# Patient Record
Sex: Male | Born: 1970 | Race: Black or African American | Hispanic: No | Marital: Married | Smoking: Former smoker
Health system: Southern US, Community
[De-identification: ages and names within clinical notes are randomized; demographics above are authoritative.]

## PROBLEM LIST (undated history)

## (undated) DIAGNOSIS — I10 Essential (primary) hypertension: Secondary | ICD-10-CM

## (undated) HISTORY — PX: KNEE ARTHROSCOPY: SUR90

## (undated) HISTORY — PX: ANKLE SURGERY: SHX546

---

## 2005-06-04 ENCOUNTER — Emergency Department (HOSPITAL_COMMUNITY): Admission: EM | Admit: 2005-06-04 | Discharge: 2005-06-04 | Payer: Self-pay | Admitting: Emergency Medicine

## 2008-05-18 ENCOUNTER — Emergency Department (HOSPITAL_BASED_OUTPATIENT_CLINIC_OR_DEPARTMENT_OTHER): Admission: EM | Admit: 2008-05-18 | Discharge: 2008-05-18 | Payer: Self-pay | Admitting: Emergency Medicine

## 2010-07-18 ENCOUNTER — Emergency Department (HOSPITAL_BASED_OUTPATIENT_CLINIC_OR_DEPARTMENT_OTHER)
Admission: EM | Admit: 2010-07-18 | Discharge: 2010-07-18 | Disposition: A | Discharge status: Home / Self Care | Payer: Self-pay | Attending: Emergency Medicine | Admitting: Emergency Medicine

## 2010-07-18 ENCOUNTER — Emergency Department (INDEPENDENT_AMBULATORY_CARE_PROVIDER_SITE_OTHER): Payer: Self-pay

## 2010-07-18 DIAGNOSIS — F17200 Nicotine dependence, unspecified, uncomplicated: Secondary | ICD-10-CM | POA: Insufficient documentation

## 2010-07-18 DIAGNOSIS — M79609 Pain in unspecified limb: Secondary | ICD-10-CM | POA: Insufficient documentation

## 2010-07-18 DIAGNOSIS — M25549 Pain in joints of unspecified hand: Secondary | ICD-10-CM

## 2010-07-18 DIAGNOSIS — M25539 Pain in unspecified wrist: Secondary | ICD-10-CM

## 2013-05-10 ENCOUNTER — Encounter (HOSPITAL_BASED_OUTPATIENT_CLINIC_OR_DEPARTMENT_OTHER): Payer: Self-pay | Admitting: Emergency Medicine

## 2013-05-10 ENCOUNTER — Emergency Department (HOSPITAL_BASED_OUTPATIENT_CLINIC_OR_DEPARTMENT_OTHER)
Admission: EM | Admit: 2013-05-10 | Discharge: 2013-05-10 | Disposition: A | Discharge status: Home / Self Care | Payer: Self-pay | Attending: Emergency Medicine | Admitting: Emergency Medicine

## 2013-05-10 DIAGNOSIS — F17200 Nicotine dependence, unspecified, uncomplicated: Secondary | ICD-10-CM | POA: Insufficient documentation

## 2013-05-10 DIAGNOSIS — Y9389 Activity, other specified: Secondary | ICD-10-CM | POA: Insufficient documentation

## 2013-05-10 DIAGNOSIS — S83419A Sprain of medial collateral ligament of unspecified knee, initial encounter: Secondary | ICD-10-CM | POA: Insufficient documentation

## 2013-05-10 DIAGNOSIS — R296 Repeated falls: Secondary | ICD-10-CM | POA: Insufficient documentation

## 2013-05-10 DIAGNOSIS — I160 Hypertensive urgency: Secondary | ICD-10-CM

## 2013-05-10 DIAGNOSIS — Y929 Unspecified place or not applicable: Secondary | ICD-10-CM | POA: Insufficient documentation

## 2013-05-10 DIAGNOSIS — X500XXA Overexertion from strenuous movement or load, initial encounter: Secondary | ICD-10-CM | POA: Insufficient documentation

## 2013-05-10 DIAGNOSIS — I10 Essential (primary) hypertension: Secondary | ICD-10-CM | POA: Insufficient documentation

## 2013-05-10 HISTORY — DX: Essential (primary) hypertension: I10

## 2013-05-10 MED ORDER — HYDROCHLOROTHIAZIDE 25 MG PO TABS: 25.0000 mg | ORAL_TABLET | Freq: Every day | ORAL | Status: DC

## 2013-05-10 MED ORDER — ACETAMINOPHEN 325 MG PO TABS: 650.0000 mg | ORAL_TABLET | Freq: Four times a day (QID) | ORAL | Status: DC | PRN

## 2013-05-10 MED ORDER — IBUPROFEN 400 MG PO TABS: 400.0000 mg | ORAL_TABLET | Freq: Four times a day (QID) | ORAL | Status: DC | PRN

## 2013-05-10 NOTE — ED Notes (Signed)
Larey SeatFell wed at work inj to left knee  Slight swelling

## 2013-05-10 NOTE — ED Notes (Signed)
Pt reports falling at work several days ago.  States (L) knee pain since that time.

## 2013-05-10 NOTE — Discharge Instructions (Signed)
Hypertension As your heart beats, it forces blood through your arteries. This force is your blood pressure. If the pressure is too high, it is called hypertension (HTN) or high blood pressure. HTN is dangerous because you may have it and not know it. High blood pressure may mean that your heart has to work harder to pump blood. Your arteries may be narrow or stiff. The extra work puts you at risk for heart disease, stroke, and other problems.  Blood pressure consists of two numbers, a higher number over a lower, 110/72, for example. It is stated as "110 over 72." The ideal is below 120 for the top number (systolic) and under 80 for the bottom (diastolic). Write down your blood pressure today. You should pay close attention to your blood pressure if you have certain conditions such as:  Heart failure.  Prior heart attack.  Diabetes  Chronic kidney disease.  Prior stroke.  Multiple risk factors for heart disease. To see if you have HTN, your blood pressure should be measured while you are seated with your arm held at the level of the heart. It should be measured at least twice. A one-time elevated blood pressure reading (especially in the Emergency Department) does not mean that you need treatment. There may be conditions in which the blood pressure is different between your right and left arms. It is important to see your caregiver soon for a recheck. Most people have essential hypertension which means that there is not a specific cause. This type of high blood pressure may be lowered by changing lifestyle factors such as:  Stress.  Smoking.  Lack of exercise.  Excessive weight.  Drug/tobacco/alcohol use.  Eating less salt. Most people do not have symptoms from high blood pressure until it has caused damage to the body. Effective treatment can often prevent, delay or reduce that damage. TREATMENT  When a cause has been identified, treatment for high blood pressure is directed at the  cause. There are a large number of medications to treat HTN. These fall into several categories, and your caregiver will help you select the medicines that are best for you. Medications may have side effects. You should review side effects with your caregiver. If your blood pressure stays high after you have made lifestyle changes or started on medicines,   Your medication(s) may need to be changed.  Other problems may need to be addressed.  Be certain you understand your prescriptions, and know how and when to take your medicine.  Be sure to follow up with your caregiver within the time frame advised (usually within two weeks) to have your blood pressure rechecked and to review your medications.  If you are taking more than one medicine to lower your blood pressure, make sure you know how and at what times they should be taken. Taking two medicines at the same time can result in blood pressure that is too low. SEEK IMMEDIATE MEDICAL CARE IF:  You develop a severe headache, blurred or changing vision, or confusion.  You have unusual weakness or numbness, or a faint feeling.  You have severe chest or abdominal pain, vomiting, or breathing problems. MAKE SURE YOU:   Understand these instructions.  Will watch your condition.  Will get help right away if you are not doing well or get worse. Document Released: 01/30/2005 Document Revised: 04/24/2011 Document Reviewed: 09/20/2007 [facility] Patient Information 2014 [place], [place].  Knee Sprain A knee sprain is a tear in one of the strong, fibrous tissues  that connect the bones (ligaments) in your knee. The severity of the sprain depends on how much of the ligament is torn. The tear can be either partial or complete. CAUSES  Often, sprains are a result of a fall or injury. The force of the impact causes the fibers of your ligament to stretch too much. This excess tension causes the fibers of your ligament to tear. SIGNS AND SYMPTOMS  You  may have some loss of motion in your knee. Other symptoms include:  Bruising.  Pain in the knee area.  Tenderness of the knee to the touch.  Swelling. DIAGNOSIS  To diagnose a knee sprain, your health care provider will physically examine your knee. Your health care provider may also suggest an X-ray exam of your knee to make sure no bones are broken. TREATMENT  If your ligament is only partially torn, treatment usually involves keeping the knee in a fixed position (immobilization) or bracing your knee for activities that require movement for several weeks. To do this, your health care provider will apply a bandage, cast, or splint to keep your knee from moving and to support your knee during movement until it heals. For a partially torn ligament, the healing process usually takes 4 6 weeks. If your ligament is completely torn, depending on which ligament it is, you may need surgery to reconnect the ligament to the bone or reconstruct it. After surgery, a cast or splint may be applied and will need to stay on your knee for 4 6 weeks while your ligament heals. HOME CARE INSTRUCTIONS  Keep your injured knee elevated to decrease swelling.  To ease pain and swelling, apply ice to the injured area:  Put ice in a plastic bag.  Place a towel between your skin and the bag.  Leave the ice on for 20 minutes, 2 3 times a day.  Only take medicine for pain as directed by your health care provider.  Do not leave your knee unprotected until pain and stiffness go away (usually 4 6 weeks).  If you have a cast or splint, do not allow it to get wet. If you have been instructed not to remove it, cover it with a plastic bag when you shower or bathe. Do not swim.  Your health care provider may suggest exercises for you to do during your recovery to prevent or limit permanent weakness and stiffness. SEEK IMMEDIATE MEDICAL CARE IF:  Your cast or splint becomes damaged.  Your pain becomes worse.  You  have significant pain, swelling, or numbness below the cast or splint. MAKE SURE YOU:  Understand these instructions.  Will watch your condition.  Will get help right away if you are not doing well or get worse. Document Released: 01/30/2005 Document Revised: 11/20/2012 Document Reviewed: 09/11/2012 [facility] Patient Information 2014 [place], [place]. RICE: Routine Care for Injuries The routine care of many injuries includes Rest, Ice, Compression, and Elevation (RICE). HOME CARE INSTRUCTIONS  Rest is needed to allow your body to heal. Routine activities can usually be resumed when comfortable. Injured tendons and bones can take up to 6 weeks to heal. Tendons are the cord-like structures that attach muscle to bone.  Ice following an injury helps keep the swelling down and reduces pain.  Put ice in a plastic bag.  Place a towel between your skin and the bag.  Leave the ice on for 15-20 minutes, 03-04 times a day. Do this while awake, for the first 24 to 48 hours. After that,  continue as directed by your caregiver.  Compression helps keep swelling down. It also gives support and helps with discomfort. If an elastic bandage has been applied, it should be removed and reapplied every 3 to 4 hours. It should not be applied tightly, but firmly enough to keep swelling down. Watch fingers or toes for swelling, bluish discoloration, coldness, numbness, or excessive pain. If any of these problems occur, remove the bandage and reapply loosely. Contact your caregiver if these problems continue.  Elevation helps reduce swelling and decreases pain. With extremities, such as the arms, hands, legs, and feet, the injured area should be placed near or above the level of the heart, if possible. SEEK IMMEDIATE MEDICAL CARE IF:  You have persistent pain and swelling.  You develop redness, numbness, or unexpected weakness.  Your symptoms are getting worse rather than improving after several days. These  symptoms may indicate that further evaluation or further X-rays are needed. Sometimes, X-rays may not show a small broken bone (fracture) until 1 week or 10 days later. Make a follow-up appointment with your caregiver. Ask when your X-ray results will be ready. Make sure you get your X-ray results. Document Released: 05/14/2000 Document Revised: 04/24/2011 Document Reviewed: 07/01/2010 [facility] Patient Information 2014 [place], [place].

## 2013-05-10 NOTE — ED Provider Notes (Signed)
CSN: [id number]     Arrival date & time 05/10/13  0501 History   First MD Initiated Contact with Patient 05/10/13 [phone]     Chief Complaint  Patient presents with  . Knee Injury     (Consider location/radiation/quality/duration/timing/severity/associated sxs/prior Treatment) HPI Comments: Pt comes in with cc of knee pain. Pt reports having a fall on Wednesday, due to wet floors. Fell on to the knee, and twisted it. No pop heard, and he started walking on it immediately. Later, he developed significant swelling, which has subsided with him icing, but he continues to have a limp and medial pain - so he decided to come to the ER. No pain meds taken at home. No hx of surgeries. No laxity with ambulation. Pt also noted to have elevated BP. He is not taking his meds, as he lost insurance. He is supposed to be on 2 agents, but only remembers hydrochlorothiazide, No chest pain, dib, headaches, visual complains.  The history is provided by the patient.    Past Medical History  Diagnosis Date  . Hypertension    History reviewed. No pertinent past surgical history. History reviewed. No pertinent family history. History  Substance Use Topics  . Smoking status: Current Every Day Smoker -- 0.50 packs/day    Types: Cigarettes  . Smokeless tobacco: Not on file  . Alcohol Use: No    Review of Systems  Constitutional: Negative for activity change and appetite change.  Eyes: Negative for visual disturbance.  Respiratory: Negative for cough and shortness of breath.   Cardiovascular: Negative for chest pain.  Gastrointestinal: Negative for abdominal pain.  Genitourinary: Negative for dysuria.  Musculoskeletal: Positive for arthralgias.  Neurological: Negative for headaches.  All other systems reviewed and are negative.      Allergies  Review of patient's allergies indicates no known allergies.  Home Medications  No current outpatient prescriptions on file. BP 186/120  Pulse 82   Temp(Src) 98.4 F (36.9 C) (Oral)  Resp 16  Ht 6' (1.829 m)  Wt 175 lb (79.379 kg)  BMI 23.73 kg/m2  SpO2 100% Physical Exam  Nursing note and vitals reviewed. Constitutional: He is oriented to person, place, and time. He appears well-developed.  HENT:  Head: Normocephalic and atraumatic.  Eyes: Conjunctivae and EOM are normal. Pupils are equal, round, and reactive to light.  Neck: Normal range of motion. Neck supple.  Cardiovascular: Normal rate and regular rhythm.   Pulmonary/Chest: Effort normal and breath sounds normal.  Abdominal: Soft. Bowel sounds are normal. He exhibits no distension. There is no tenderness. There is no rebound and no guarding.  Musculoskeletal:  Left knee has mild effusion medially. There is tenderness to palpation on the medial part. Pt has no laxity with valgus, varus stress and with ant and post drawers test. Pt ambulated for me, with a mild limp. No crepitus  Neurological: He is alert and oriented to person, place, and time.  Skin: Skin is warm.    ED Course  Procedures (including critical care time) Labs Review Labs Reviewed - No data to display Imaging Review No results found.   EKG Interpretation None      MDM   Final diagnoses:  None    Pt comes in with cc of knee pain. Recent fall, 2-3 days ago, has been ambulating, doubt fracture. No joint laxity. Suspect some meniscal injury or ligamentous injury. RICE and partial weight bearing advised.  Also, BP is high. Asymptomatic. Will start him on HCTZ. [facility] info  will be provided.  Derwood KaplanAnkit Jaxon Mynhier, MD 05/10/13 [phone]

## 2013-05-10 NOTE — ED Notes (Signed)
Left knee pain from fall on wed

## 2013-05-12 NOTE — ED Notes (Signed)
Opened chart due to patient calling to ask about light duty note that was given

## 2014-03-13 ENCOUNTER — Emergency Department (HOSPITAL_BASED_OUTPATIENT_CLINIC_OR_DEPARTMENT_OTHER)
Admission: EM | Admit: 2014-03-13 | Discharge: 2014-03-13 | Disposition: A | Discharge status: Home / Self Care | Payer: BLUE CROSS/BLUE SHIELD | Attending: Emergency Medicine | Admitting: Emergency Medicine

## 2014-03-13 ENCOUNTER — Encounter (HOSPITAL_BASED_OUTPATIENT_CLINIC_OR_DEPARTMENT_OTHER): Payer: Self-pay

## 2014-03-13 ENCOUNTER — Emergency Department (HOSPITAL_BASED_OUTPATIENT_CLINIC_OR_DEPARTMENT_OTHER): Payer: BLUE CROSS/BLUE SHIELD

## 2014-03-13 DIAGNOSIS — I10 Essential (primary) hypertension: Secondary | ICD-10-CM | POA: Diagnosis not present

## 2014-03-13 DIAGNOSIS — R51 Headache: Secondary | ICD-10-CM | POA: Insufficient documentation

## 2014-03-13 DIAGNOSIS — R519 Headache, unspecified: Secondary | ICD-10-CM

## 2014-03-13 DIAGNOSIS — Z79899 Other long term (current) drug therapy: Secondary | ICD-10-CM | POA: Diagnosis not present

## 2014-03-13 DIAGNOSIS — R42 Dizziness and giddiness: Secondary | ICD-10-CM | POA: Insufficient documentation

## 2014-03-13 LAB — BASIC METABOLIC PANEL
ANION GAP: 4 — AB (ref 5–15)
BUN: 21 mg/dL (ref 6–23)
CALCIUM: 9 mg/dL (ref 8.4–10.5)
CO2: 24 mmol/L (ref 19–32)
Chloride: 108 mmol/L (ref 96–112)
Creatinine, Ser: 1.47 mg/dL — ABNORMAL HIGH (ref 0.50–1.35)
GFR calc Af Amer: 67 mL/min — ABNORMAL LOW (ref >90)
GFR calc non Af Amer: 58 mL/min — ABNORMAL LOW (ref >90)
GLUCOSE: 104 mg/dL — AB (ref 70–99)
POTASSIUM: 3.5 mmol/L (ref 3.5–5.1)
SODIUM: 136 mmol/L (ref 135–145)

## 2014-03-13 LAB — CBC WITH DIFFERENTIAL/PLATELET
BASOS ABS: 0 10*3/uL (ref 0.0–0.1)
Basophils Relative: 0 % (ref 0–1)
Eosinophils Absolute: 0.1 10*3/uL (ref 0.0–0.7)
Eosinophils Relative: 2 % (ref 0–5)
HEMATOCRIT: 39.7 % (ref 39.0–52.0)
Hemoglobin: 13.3 g/dL (ref 13.0–17.0)
LYMPHS PCT: 29 % (ref 12–46)
Lymphs Abs: 2.2 10*3/uL (ref 0.7–4.0)
MCH: 30.9 pg (ref 26.0–34.0)
MCHC: 33.5 g/dL (ref 30.0–36.0)
MCV: 92.3 fL (ref 78.0–100.0)
MONO ABS: 0.7 10*3/uL (ref 0.1–1.0)
MONOS PCT: 9 % (ref 3–12)
NEUTROS ABS: 4.6 10*3/uL (ref 1.7–7.7)
NEUTROS PCT: 60 % (ref 43–77)
PLATELETS: 249 10*3/uL (ref 150–400)
RBC: 4.3 MIL/uL (ref 4.22–5.81)
RDW: 12.9 % (ref 11.5–15.5)
WBC: 7.7 10*3/uL (ref 4.0–10.5)

## 2014-03-13 LAB — URINALYSIS, ROUTINE W REFLEX MICROSCOPIC
BILIRUBIN URINE: NEGATIVE
GLUCOSE, UA: NEGATIVE mg/dL
HGB URINE DIPSTICK: NEGATIVE
KETONES UR: NEGATIVE mg/dL
Leukocytes, UA: NEGATIVE
Nitrite: NEGATIVE
Protein, ur: NEGATIVE mg/dL
Specific Gravity, Urine: 1.02 (ref 1.005–1.030)
Urobilinogen, UA: 0.2 mg/dL (ref 0.0–1.0)
pH: 6 (ref 5.0–8.0)

## 2014-03-13 LAB — TROPONIN I

## 2014-03-13 MED ORDER — HYDROCHLOROTHIAZIDE 25 MG PO TABS
25.0000 mg | ORAL_TABLET | Freq: Once | ORAL | Status: AC
  Administered 2014-03-13: 25 mg via ORAL
  Filled 2014-03-13: qty 1

## 2014-03-13 MED ORDER — HYDROCHLOROTHIAZIDE 25 MG PO TABS: 25.0000 mg | ORAL_TABLET | Freq: Every day | ORAL | Status: DC

## 2014-03-13 NOTE — Discharge Instructions (Signed)

## 2014-03-13 NOTE — ED Notes (Signed)
Headache that started this am while walking the dog.  Denies nausea, vomiting. Hx HTN, out of medication x months.

## 2014-03-13 NOTE — ED Provider Notes (Signed)
CSN: [id number]     Arrival date & time 03/13/14  [id number] History   First MD Initiated Contact with Patient 03/13/14 [phone]     Chief Complaint  Patient presents with  . Headache     (Consider location/radiation/quality/duration/timing/severity/associated sxs/prior Treatment) Patient is a 44 y.o. male presenting with headaches.  Headache Pain location:  Generalized Quality:  Dull Radiates to:  Does not radiate Onset quality:  Gradual Duration: 20 minutes. Timing:  Constant Progression:  Partially resolved Chronicity:  New Similar to prior headaches: no   Context: exposure to cold air   Context comment:  While walking dog, associated with lightheadedness Relieved by:  Nothing Worsened by:  Nothing tried Associated symptoms: no abdominal pain, no fever, no neck pain, no neck stiffness and no URI     Past Medical History  Diagnosis Date  . Hypertension    Past Surgical History  Procedure Laterality Date  . Ankle surgery     No family history on file. History  Substance Use Topics  . Smoking status: Current Every Day Smoker -- 0.50 packs/day    Types: Cigars  . Smokeless tobacco: Not on file  . Alcohol Use: No    Review of Systems  Constitutional: Negative for fever.  Gastrointestinal: Negative for abdominal pain.  Musculoskeletal: Negative for neck pain and neck stiffness.  Neurological: Positive for headaches.  All other systems reviewed and are negative.     Allergies  Review of patient's allergies indicates no known allergies.  Home Medications   Prior to Admission medications   Medication Sig Start Date End Date Taking? Authorizing Provider  acetaminophen (TYLENOL) 325 MG tablet Take 2 tablets (650 mg total) by mouth every 6 (six) hours as needed. 05/10/13   Derwood KaplanAnkit Nanavati, MD  hydrochlorothiazide (HYDRODIURIL) 25 MG tablet Take 1 tablet (25 mg total) by mouth daily. 03/13/14   Mirian MoMatthew Gentry, MD  ibuprofen (ADVIL,MOTRIN) 400 MG tablet Take 1 tablet (400 mg  total) by mouth every 6 (six) hours as needed. 05/10/13   Ankit Nanavati, MD   BP 161/111 mmHg  Pulse 65  Temp(Src) 97.1 F (36.2 C) (Oral)  Resp 18  Ht 6' (1.829 m)  Wt 165 lb (74.844 kg)  BMI 22.37 kg/m2  SpO2 100% Physical Exam  Constitutional: He is oriented to person, place, and time. He appears well-developed and well-nourished.  HENT:  Head: Normocephalic and atraumatic.  Eyes: Conjunctivae and EOM are normal.  Neck: Normal range of motion. Neck supple.  Cardiovascular: Normal rate, regular rhythm and normal heart sounds.   Pulmonary/Chest: Effort normal and breath sounds normal. No respiratory distress.  Abdominal: He exhibits no distension. There is no tenderness. There is no rebound and no guarding.  Musculoskeletal: Normal range of motion.  Neurological: He is alert and oriented to person, place, and time. He has normal strength and normal reflexes. No cranial nerve deficit or sensory deficit. Coordination and gait normal. GCS eye subscore is 4. GCS verbal subscore is 5. GCS motor subscore is 6.  Skin: Skin is warm and dry.  Vitals reviewed.   ED Course  Procedures (including critical care time) Labs Review Labs Reviewed  BASIC METABOLIC PANEL - Abnormal; Notable for the following:    Glucose, Bld 104 (*)    Creatinine, Ser 1.47 (*)    GFR calc non Af Amer 58 (*)    GFR calc Af Amer 67 (*)    Anion gap 4 (*)    All other components within normal limits  CBC  WITH DIFFERENTIAL/PLATELET  URINALYSIS, ROUTINE W REFLEX MICROSCOPIC  TROPONIN I  TROPONIN I    Imaging Review No results found.   EKG Interpretation   Date/Time:  Friday March 13 2014 08:19:44 EST Ventricular Rate:  60 PR Interval:  180 QRS Duration: 110 QT Interval:  426 QTC Calculation: 426 R Axis:   -21 Text Interpretation:  Normal sinus rhythm Possible Left atrial enlargement  Borderline ECG No old tracing to compare Confirmed by Mirian Mo  [phone]) on 03/13/2014 8:23:22 AM       MDM   Final diagnoses:  Lightheadedness  Acute nonintractable headache, unspecified headache type    44 y.o. male with pertinent PMH of HTN presents with headache and lightheadedness as described above. Patient denies syncope. No chest pain or shortness of breath. No recent infectious symptoms. The patient has been noncompliant with his home hydrochlorothiazide. On arrival vital signs and physical exam as above.  Negative wu with exception of elevated cr. Delta troponin negative.  Discussed elevation and pt will fu with PCP, as well as start taking his BP medications.  Stressed importance of compliance and fu repeatedly.    I have reviewed all laboratory and imaging studies if ordered as above  1. Lightheadedness   2. Acute nonintractable headache, unspecified headache type         Mirian Mo, MD 03/17/14 [phone]

## 2014-04-27 ENCOUNTER — Emergency Department (HOSPITAL_BASED_OUTPATIENT_CLINIC_OR_DEPARTMENT_OTHER)
Admission: EM | Admit: 2014-04-27 | Discharge: 2014-04-27 | Disposition: A | Discharge status: Home / Self Care | Payer: BLUE CROSS/BLUE SHIELD | Attending: Emergency Medicine | Admitting: Emergency Medicine

## 2014-04-27 ENCOUNTER — Encounter (HOSPITAL_BASED_OUTPATIENT_CLINIC_OR_DEPARTMENT_OTHER): Payer: Self-pay | Admitting: Emergency Medicine

## 2014-04-27 DIAGNOSIS — Z720 Tobacco use: Secondary | ICD-10-CM | POA: Diagnosis not present

## 2014-04-27 DIAGNOSIS — M791 Myalgia: Secondary | ICD-10-CM | POA: Diagnosis not present

## 2014-04-27 DIAGNOSIS — Z79899 Other long term (current) drug therapy: Secondary | ICD-10-CM | POA: Diagnosis not present

## 2014-04-27 DIAGNOSIS — I10 Essential (primary) hypertension: Secondary | ICD-10-CM

## 2014-04-27 DIAGNOSIS — M79642 Pain in left hand: Secondary | ICD-10-CM | POA: Diagnosis present

## 2014-04-27 LAB — COMPREHENSIVE METABOLIC PANEL
ALBUMIN: 4.7 g/dL (ref 3.5–5.2)
ALK PHOS: 43 U/L (ref 39–117)
ALT: 16 U/L (ref 0–53)
ANION GAP: 8 (ref 5–15)
AST: 24 U/L (ref 0–37)
BUN: 19 mg/dL (ref 6–23)
CALCIUM: 9.2 mg/dL (ref 8.4–10.5)
CO2: 26 mmol/L (ref 19–32)
Chloride: 103 mmol/L (ref 96–112)
Creatinine, Ser: 1.53 mg/dL — ABNORMAL HIGH (ref 0.50–1.35)
GFR calc Af Amer: 64 mL/min — ABNORMAL LOW (ref >90)
GFR calc non Af Amer: 55 mL/min — ABNORMAL LOW (ref >90)
Glucose, Bld: 91 mg/dL (ref 70–99)
POTASSIUM: 4 mmol/L (ref 3.5–5.1)
Sodium: 137 mmol/L (ref 135–145)
TOTAL PROTEIN: 7.9 g/dL (ref 6.0–8.3)
Total Bilirubin: 0.5 mg/dL (ref 0.3–1.2)

## 2014-04-27 LAB — CBC WITH DIFFERENTIAL/PLATELET
BASOS PCT: 0 % (ref 0–1)
Basophils Absolute: 0 10*3/uL (ref 0.0–0.1)
Eosinophils Absolute: 0.2 10*3/uL (ref 0.0–0.7)
Eosinophils Relative: 2 % (ref 0–5)
HEMATOCRIT: 40.5 % (ref 39.0–52.0)
HEMOGLOBIN: 13.6 g/dL (ref 13.0–17.0)
LYMPHS ABS: 2.7 10*3/uL (ref 0.7–4.0)
LYMPHS PCT: 34 % (ref 12–46)
MCH: 31.3 pg (ref 26.0–34.0)
MCHC: 33.6 g/dL (ref 30.0–36.0)
MCV: 93.1 fL (ref 78.0–100.0)
MONO ABS: 0.7 10*3/uL (ref 0.1–1.0)
MONOS PCT: 8 % (ref 3–12)
NEUTROS ABS: 4.5 10*3/uL (ref 1.7–7.7)
NEUTROS PCT: 56 % (ref 43–77)
Platelets: 226 10*3/uL (ref 150–400)
RBC: 4.35 MIL/uL (ref 4.22–5.81)
RDW: 13 % (ref 11.5–15.5)
WBC: 8 10*3/uL (ref 4.0–10.5)

## 2014-04-27 LAB — BRAIN NATRIURETIC PEPTIDE: B NATRIURETIC PEPTIDE 5: 14.1 pg/mL (ref 0.0–100.0)

## 2014-04-27 MED ORDER — AMLODIPINE BESYLATE 5 MG PO TABS
5.0000 mg | ORAL_TABLET | Freq: Once | ORAL | Status: AC
  Administered 2014-04-27: 5 mg via ORAL
  Filled 2014-04-27: qty 1

## 2014-04-27 MED ORDER — AMLODIPINE BESYLATE 5 MG PO TABS: 5.0000 mg | ORAL_TABLET | Freq: Every day | ORAL | Status: DC

## 2014-04-27 NOTE — ED Provider Notes (Signed)
CSN: [id number]     Arrival date & time 04/27/14  1323 History  This chart was scribed for Glynn Octave, MD by Freida Busman, ED Scribe. This patient was seen in room MH02/MH02 and the patient's care was started 3:10 PM.    Chief Complaint  Patient presents with  . Hand Pain    right side   The history is provided by the patient. No language interpreter was used.    HPI Comments:  Russell Henderson is a 44 y.o. male with a past medical h/o HTN who presents to the Emergency Department complaining of constant swelling to his bilateral hands that he woke up with this am.He notes swelling has moderately improved since onset without intervention. He denies pain and injury to his hands. He also denies CP, HA, SOB, and lower extremity edema. He is currently non-compliant with HTN medications; has not taken HCTZ since Jan 2016.   No modifying factors noted.   Past Medical History  Diagnosis Date  . Hypertension    Past Surgical History  Procedure Laterality Date  . Ankle surgery     History reviewed. No pertinent family history. History  Substance Use Topics  . Smoking status: Current Every Day Smoker -- 0.50 packs/day    Types: Cigars  . Smokeless tobacco: Not on file  . Alcohol Use: No    Review of Systems  Constitutional: Negative for fever.  Respiratory: Negative for shortness of breath.   Cardiovascular: Negative for chest pain.  Musculoskeletal: Positive for myalgias.  Neurological: Negative for headaches.  All other systems reviewed and are negative.     Allergies  Review of patient's allergies indicates no known allergies.  Home Medications   Prior to Admission medications   Medication Sig Start Date End Date Taking? Authorizing Provider  acetaminophen (TYLENOL) 325 MG tablet Take 2 tablets (650 mg total) by mouth every 6 (six) hours as needed. 05/10/13   Derwood Kaplan, MD  amLODipine (NORVASC) 5 MG tablet Take 1 tablet (5 mg total) by mouth daily. 04/27/14    Glynn Octave, MD  hydrochlorothiazide (HYDRODIURIL) 25 MG tablet Take 1 tablet (25 mg total) by mouth daily. 03/13/14   Mirian Mo, MD  ibuprofen (ADVIL,MOTRIN) 400 MG tablet Take 1 tablet (400 mg total) by mouth every 6 (six) hours as needed. 05/10/13   Ankit Rhunette Croft, MD   BP 151/97 mmHg  Pulse 59  Temp(Src) 98.2 F (36.8 C) (Oral)  Resp 18  Ht 6' (1.829 m)  Wt 165 lb (74.844 kg)  BMI 22.37 kg/m2  SpO2 100% Physical Exam  Constitutional: He is oriented to person, place, and time. He appears well-developed and well-nourished. No distress.  HENT:  Head: Normocephalic and atraumatic.  Mouth/Throat: Oropharynx is clear and moist. No oropharyngeal exudate.  Eyes: Conjunctivae and EOM are normal. Pupils are equal, round, and reactive to light.  Neck: Normal range of motion. Neck supple.  No meningismus.  Cardiovascular: Normal rate, regular rhythm, normal heart sounds and intact distal pulses.   No murmur heard. Pulmonary/Chest: Effort normal and breath sounds normal. No respiratory distress.  Abdominal: Soft. There is no tenderness. There is no rebound and no guarding.  Musculoskeletal: Normal range of motion. He exhibits no edema or tenderness.  No appreciable hand swelling Intact radial pulses  No LE swelling   Neurological: He is alert and oriented to person, place, and time. No cranial nerve deficit. He exhibits normal muscle tone. Coordination normal.  No ataxia on finger to nose bilaterally. No pronator  drift. 5/5 strength throughout. CN 2-12 intact. Negative Romberg. Equal grip strength. Sensation intact. Gait is normal.   Skin: Skin is warm.  Psychiatric: He has a normal mood and affect. His behavior is normal.  Nursing note and vitals reviewed.   ED Course  Procedures   DIAGNOSTIC STUDIES:  Oxygen Saturation is 100% on RA, normal by my interpretation.    COORDINATION OF CARE:  3:14 PM Will order blood work. Discussed treatment plan with pt at bedside and pt  agreed to plan.  4:14 PM Pt reassessed and updated with results.   Labs Review Labs Reviewed  COMPREHENSIVE METABOLIC PANEL - Abnormal; Notable for the following:    Creatinine, Ser 1.53 (*)    GFR calc non Af Amer 55 (*)    GFR calc Af Amer 64 (*)    All other components within normal limits  CBC WITH DIFFERENTIAL/PLATELET  BRAIN NATRIURETIC PEPTIDE    Imaging Review No results found.   EKG Interpretation None      MDM   Final diagnoses:  Essential hypertension   Awoke this morning with bilateral hand swelling which is since improved. No pain. Patient noncompliant with blood pressure medication. No chest pain, headache, shortness of breath, visual changes.  Neurovascular intact. No appreciable hand swelling on exam.  Creatinine stable. Patient started on by mouth amlodipine as he did not tolerate hydrochlorothiazide.   labs at baseline. Patient asymptomatic. Blood pressure 166/111. Patient started on amlodipine. States he has PCP follow-up this week. Informed that hypertension is a chronic disease which can cause damage to brain, eyes, heart, kidneys. Return precautions discussed.  BP 151/97 mmHg  Pulse 59  Temp(Src) 98.2 F (36.8 C) (Oral)  Resp 18  Ht 6' (1.829 m)  Wt 165 lb (74.844 kg)  BMI 22.37 kg/m2  SpO2 100%   I personally performed the services described in this documentation, which was scribed in my presence. The recorded information has been reviewed and is accurate.   Glynn OctaveStephen Quandra Fedorchak, MD 04/28/14 [phone]

## 2014-04-27 NOTE — ED Notes (Signed)
Pt states he woke up this morning and his hands started swelling, he thinks it's his BP

## 2014-04-27 NOTE — Discharge Instructions (Signed)
Hypertension Follow-up with your doctor. Her blood pressure medication as prescribed. Return to the ED develop headache, chest pain, shortness of breath or any other concerns. Hypertension, commonly called high blood pressure, is when the force of blood pumping through your arteries is too strong. Your arteries are the blood vessels that carry blood from your heart throughout your body. A blood pressure reading consists of a higher number over a lower number, such as 110/72. The higher number (systolic) is the pressure inside your arteries when your heart pumps. The lower number (diastolic) is the pressure inside your arteries when your heart relaxes. Ideally you want your blood pressure below 120/80. Hypertension forces your heart to work harder to pump blood. Your arteries may become narrow or stiff. Having hypertension puts you at risk for heart disease, stroke, and other problems.  RISK FACTORS Some risk factors for high blood pressure are controllable. Others are not.  Risk factors you cannot control include:   Race. You may be at higher risk if you are African American.  Age. Risk increases with age.  Gender. Men are at higher risk than women before age 42 years. After age 42, women are at higher risk than men. Risk factors you can control include:  Not getting enough exercise or physical activity.  Being overweight.  Getting too much fat, sugar, calories, or salt in your diet.  Drinking too much alcohol. SIGNS AND SYMPTOMS Hypertension does not usually cause signs or symptoms. Extremely high blood pressure (hypertensive crisis) may cause headache, anxiety, shortness of breath, and nosebleed. DIAGNOSIS  To check if you have hypertension, your health care provider will measure your blood pressure while you are seated, with your arm held at the level of your heart. It should be measured at least twice using the same arm. Certain conditions can cause a difference in blood pressure between  your right and left arms. A blood pressure reading that is higher than normal on one occasion does not mean that you need treatment. If one blood pressure reading is high, ask your health care provider about having it checked again. TREATMENT  Treating high blood pressure includes making lifestyle changes and possibly taking medicine. Living a healthy lifestyle can help lower high blood pressure. You may need to change some of your habits. Lifestyle changes may include:  Following the DASH diet. This diet is high in fruits, vegetables, and whole grains. It is low in salt, red meat, and added sugars.  Getting at least 2 hours of brisk physical activity every week.  Losing weight if necessary.  Not smoking.  Limiting alcoholic beverages.  Learning ways to reduce stress. If lifestyle changes are not enough to get your blood pressure under control, your health care provider may prescribe medicine. You may need to take more than one. Work closely with your health care provider to understand the risks and benefits. HOME CARE INSTRUCTIONS  Have your blood pressure rechecked as directed by your health care provider.   Take medicines only as directed by your health care provider. Follow the directions carefully. Blood pressure medicines must be taken as prescribed. The medicine does not work as well when you skip doses. Skipping doses also puts you at risk for problems.   Do not smoke.   Monitor your blood pressure at home as directed by your health care provider. SEEK MEDICAL CARE IF:   You think you are having a reaction to medicines taken.  You have recurrent headaches or feel dizzy.  You  have swelling in your ankles.  You have trouble with your vision. SEEK IMMEDIATE MEDICAL CARE IF:  You develop a severe headache or confusion.  You have unusual weakness, numbness, or feel faint.  You have severe chest or abdominal pain.  You vomit repeatedly.  You have trouble  breathing. MAKE SURE YOU:   Understand these instructions.  Will watch your condition.  Will get help right away if you are not doing well or get worse. Document Released: 01/30/2005 Document Revised: 06/16/2013 Document Reviewed: 11/22/2012 [facility] Patient Information 2015 [place], [place]. This information is not intended to replace advice given to you by your health care provider. Make sure you discuss any questions you have with your health care provider.

## 2014-09-21 ENCOUNTER — Encounter (HOSPITAL_BASED_OUTPATIENT_CLINIC_OR_DEPARTMENT_OTHER): Payer: Self-pay | Admitting: *Deleted

## 2014-09-21 ENCOUNTER — Emergency Department (HOSPITAL_BASED_OUTPATIENT_CLINIC_OR_DEPARTMENT_OTHER)
Admission: EM | Admit: 2014-09-21 | Discharge: 2014-09-21 | Disposition: A | Discharge status: Home / Self Care | Payer: BLUE CROSS/BLUE SHIELD | Attending: Emergency Medicine | Admitting: Emergency Medicine

## 2014-09-21 DIAGNOSIS — Z720 Tobacco use: Secondary | ICD-10-CM | POA: Diagnosis not present

## 2014-09-21 DIAGNOSIS — I10 Essential (primary) hypertension: Secondary | ICD-10-CM | POA: Diagnosis not present

## 2014-09-21 DIAGNOSIS — L0201 Cutaneous abscess of face: Secondary | ICD-10-CM | POA: Diagnosis not present

## 2014-09-21 DIAGNOSIS — Z79899 Other long term (current) drug therapy: Secondary | ICD-10-CM | POA: Insufficient documentation

## 2014-09-21 DIAGNOSIS — L988 Other specified disorders of the skin and subcutaneous tissue: Secondary | ICD-10-CM | POA: Diagnosis present

## 2014-09-21 DIAGNOSIS — L0291 Cutaneous abscess, unspecified: Secondary | ICD-10-CM

## 2014-09-21 MED ORDER — LIDOCAINE-EPINEPHRINE (PF) 2 %-1:200000 IJ SOLN
20.0000 mL | Freq: Once | INTRAMUSCULAR | Status: AC
  Administered 2014-09-21: 20 mL via INTRADERMAL
  Filled 2014-09-21: qty 20

## 2014-09-21 MED ORDER — OXYCODONE-ACETAMINOPHEN 5-325 MG PO TABS
1.0000 | ORAL_TABLET | Freq: Once | ORAL | Status: AC
  Administered 2014-09-21: 1 via ORAL
  Filled 2014-09-21: qty 1

## 2014-09-21 NOTE — ED Notes (Signed)
Pt reports abcess to right jaw area x 1 week. Denies any fevers or other c/o.

## 2014-09-21 NOTE — ED Provider Notes (Signed)
CSN: [id number]     Arrival date & time 09/21/14  5621 History   First MD Initiated Contact with Patient 09/21/14 [phone]     Chief Complaint  Patient presents with  . Skin Problem     (Consider location/radiation/quality/duration/timing/severity/associated sxs/prior Treatment) Patient is a 44 y.o. male presenting with abscess.  Abscess Location:  Head/neck Head/neck abscess location: R jawline. Size:  2 cm Abscess quality: draining, fluctuance and painful   Red streaking: no   Duration:  1 week Progression:  Worsening Chronicity:  New Context: not diabetes   Relieved by:  Nothing Worsened by:  Nothing tried Ineffective treatments:  Draining/squeezing Associated symptoms: no anorexia, no fever, no headaches, no nausea and no vomiting     Past Medical History  Diagnosis Date  . Hypertension    Past Surgical History  Procedure Laterality Date  . Ankle surgery     History reviewed. No pertinent family history. History  Substance Use Topics  . Smoking status: Current Every Day Smoker -- 0.50 packs/day    Types: Cigars  . Smokeless tobacco: Not on file  . Alcohol Use: No    Review of Systems  Constitutional: Negative for fever.  Gastrointestinal: Negative for nausea, vomiting and anorexia.  Neurological: Negative for headaches.  All other systems reviewed and are negative.     Allergies  Review of patient's allergies indicates no known allergies.  Home Medications   Prior to Admission medications   Medication Sig Start Date End Date Taking? Authorizing Provider  acetaminophen (TYLENOL) 325 MG tablet Take 2 tablets (650 mg total) by mouth every 6 (six) hours as needed. 05/10/13   Derwood Kaplan, MD  amLODipine (NORVASC) 5 MG tablet Take 1 tablet (5 mg total) by mouth daily. Patient taking differently: Take 10 mg by mouth daily.  04/27/14   Glynn Octave, MD  ibuprofen (ADVIL,MOTRIN) 400 MG tablet Take 1 tablet (400 mg total) by mouth every 6 (six) hours as needed.  05/10/13   Ankit Rhunette Croft, MD   BP 147/94 mmHg  Pulse 62  Temp(Src) 98.6 F (37 C) (Oral)  Resp 16  Ht 6' (1.829 m)  Wt 160 lb (72.576 kg)  BMI 21.70 kg/m2  SpO2 100% Physical Exam  Constitutional: He is oriented to person, place, and time. He appears well-developed and well-nourished.  HENT:  Head: Normocephalic and atraumatic.  Eyes: Conjunctivae and EOM are normal.  Neck: Normal range of motion. Neck supple.  Cardiovascular: Normal rate, regular rhythm and normal heart sounds.   Pulmonary/Chest: Effort normal and breath sounds normal. No respiratory distress.  Abdominal: He exhibits no distension. There is no tenderness. There is no rebound and no guarding.  Musculoskeletal: Normal range of motion.  Neurological: He is alert and oriented to person, place, and time.  Skin: Skin is warm and dry.  2 cm fluctuant abscess to R mandibular angle  Vitals reviewed.   ED Course  INCISION AND DRAINAGE Date/Time: 09/21/2014 7:02 AM Performed by: Mirian Mo Authorized by: Mirian Mo Consent: Verbal consent obtained. Type: abscess Body area: head/neck Location details: face Anesthesia: local infiltration Local anesthetic: lidocaine 2% with epinephrine Patient sedated: no Scalpel size: 11 Incision type: single straight Complexity: simple Drainage: purulent Drainage amount: scant Wound treatment: wound left open Patient tolerance: Patient tolerated the procedure well with no immediate complications   (including critical care time) Labs Review Labs Reviewed - No data to display  Imaging Review No results found.   EKG Interpretation None      MDM  Final diagnoses:  Abscess    44 y.o. male with pertinent PMH of HTN presents with abscess to R face.  Draining and improved PTA.  I&D as above.  No surrounding signs of infection.  DC home in stable condition.    I have reviewed all laboratory and imaging studies if ordered as above  1. Abscess          Mirian Mo, MD 09/21/14 [phone]

## 2014-09-21 NOTE — ED Notes (Signed)
MD at bedside. 

## 2014-09-21 NOTE — Discharge Instructions (Signed)

## 2016-05-20 ENCOUNTER — Encounter (HOSPITAL_BASED_OUTPATIENT_CLINIC_OR_DEPARTMENT_OTHER): Payer: Self-pay | Admitting: *Deleted

## 2016-05-20 ENCOUNTER — Emergency Department (HOSPITAL_BASED_OUTPATIENT_CLINIC_OR_DEPARTMENT_OTHER)
Admission: EM | Admit: 2016-05-20 | Discharge: 2016-05-20 | Disposition: A | Discharge status: Home / Self Care | Payer: BLUE CROSS/BLUE SHIELD | Attending: Emergency Medicine | Admitting: Emergency Medicine

## 2016-05-20 DIAGNOSIS — K029 Dental caries, unspecified: Secondary | ICD-10-CM | POA: Insufficient documentation

## 2016-05-20 DIAGNOSIS — I10 Essential (primary) hypertension: Secondary | ICD-10-CM | POA: Insufficient documentation

## 2016-05-20 DIAGNOSIS — Z87891 Personal history of nicotine dependence: Secondary | ICD-10-CM | POA: Insufficient documentation

## 2016-05-20 MED ORDER — CLINDAMYCIN HCL 150 MG PO CAPS
300.0000 mg | ORAL_CAPSULE | Freq: Once | ORAL | Status: AC
  Administered 2016-05-20: 300 mg via ORAL
  Filled 2016-05-20: qty 2

## 2016-05-20 MED ORDER — CLINDAMYCIN HCL 300 MG PO CAPS: 300.0000 mg | ORAL_CAPSULE | Freq: Four times a day (QID) | ORAL | 0 refills | Status: AC

## 2016-05-20 MED ORDER — IBUPROFEN 800 MG PO TABS: 800.0000 mg | ORAL_TABLET | Freq: Three times a day (TID) | ORAL | 0 refills | Status: AC

## 2016-05-20 MED ORDER — IBUPROFEN 800 MG PO TABS
800.0000 mg | ORAL_TABLET | Freq: Once | ORAL | Status: AC
  Administered 2016-05-20: 800 mg via ORAL
  Filled 2016-05-20: qty 1

## 2016-05-20 NOTE — ED Triage Notes (Signed)
right sided lower jaw dental pain and swelling x 3 days has an appt in 2 weeks

## 2016-05-20 NOTE — ED Provider Notes (Signed)
[facility]   CSN: [id number] Arrival date & time: 05/20/16  0438     History   Chief Complaint Chief Complaint  Patient presents with  . Dental Pain    HPI Russell Henderson is a 46 y.o. male.  The history is provided by the patient.  Dental Pain   This is a new problem. The current episode started 2 days ago. The problem occurs constantly. The problem has not changed since onset.The pain is moderate. He has tried nothing for the symptoms. The treatment provided no relief.  Multiple bad teeth, has called a dentist who cannot see him for 2 weeks.  No f/c/r.    Past Medical History:  Diagnosis Date  . Hypertension     There are no active problems to display for this patient.   Past Surgical History:  Procedure Laterality Date  . ANKLE SURGERY    . KNEE ARTHROSCOPY         Home Medications    Prior to Admission medications   Medication Sig Start Date End Date Taking? Authorizing Provider  lisinopril (PRINIVIL,ZESTRIL) 5 MG tablet Take 5 mg by mouth daily.   Yes Historical Provider, MD  clindamycin (CLEOCIN) 300 MG capsule Take 1 capsule (300 mg total) by mouth 4 (four) times daily. X 7 days 05/20/16   Annice Jolly, MD  ibuprofen (ADVIL,MOTRIN) 800 MG tablet Take 1 tablet (800 mg total) by mouth 3 (three) times daily. 05/20/16   Cy Blamer, MD    Family History History reviewed. No pertinent family history.  Social History Social History  Substance Use Topics  . Smoking status: Former Smoker    Packs/day: 0.50    Types: Cigars  . Smokeless tobacco: Never Used  . Alcohol use No     Allergies   Patient has no known allergies.   Review of Systems Review of Systems  Constitutional: Negative for fever.  HENT: Positive for dental problem. Negative for congestion, drooling, sore throat, trouble swallowing and voice change.   Respiratory: Negative for shortness of breath.   All other systems reviewed and are  negative.    Physical Exam Updated Vital Signs BP (!) 190/122 (BP Location: Right Arm)   Pulse 95   Temp 98.7 F (37.1 C) (Oral)   Resp 16   Ht 6' (1.829 m)   Wt 165 lb (74.8 kg)   SpO2 100%   BMI 22.38 kg/m   Physical Exam  Constitutional: He is oriented to person, place, and time. He appears well-developed and well-nourished. No distress.  HENT:  Head: Normocephalic and atraumatic.  Mouth/Throat: No trismus in the jaw. Dental caries present. No uvula swelling. No oropharyngeal exudate, posterior oropharyngeal edema or posterior oropharyngeal erythema.    Eyes: Conjunctivae and EOM are normal.  Neck: Normal range of motion. Neck supple. No tracheal deviation present.  Cardiovascular: Normal rate, regular rhythm and intact distal pulses.   Pulmonary/Chest: Effort normal and breath sounds normal. He has no wheezes. He has no rales.  Abdominal: Soft. Bowel sounds are normal. There is no tenderness.  Musculoskeletal: Normal range of motion.  Lymphadenopathy:    He has no cervical adenopathy.  Neurological: He is alert and oriented to person, place, and time.  Skin: Skin is warm and dry. Capillary refill takes less than 2 seconds.  Psychiatric: He has a normal mood and affect.     ED Treatments / Results   Vitals:   05/20/16 0443  BP: (!) 190/122  Pulse: 95  Resp: 16  Temp: 98.7 F (37.1 C)    Radiology No results found.  Procedures Procedures (including critical care time)  Medications Ordered in ED Medications  clindamycin (CLEOCIN) capsule 300 mg (not administered)  ibuprofen (ADVIL,MOTRIN) tablet 800 mg (not administered)     Final Clinical Impressions(s) / ED Diagnoses   Final diagnoses:  Dental caries   The patient is nontoxic-appearing on exam and vital signs are within normal limits. Return for fevers, facial or neck swelling, inability to open mouth, intractable pain, recurrent bleeding or any concerns.  Follow up with dentistry.  Take all  antibiotics and use mouth rinses three times a day.    After history, exam, and medical workup I feel the patient has been appropriately medically screened and is safe for discharge home. Pertinent diagnoses were discussed with the patient. Patient was given return precautions. New Prescriptions New Prescriptions   CLINDAMYCIN (CLEOCIN) 300 MG CAPSULE    Take 1 capsule (300 mg total) by mouth 4 (four) times daily. X 7 days   IBUPROFEN (ADVIL,MOTRIN) 800 MG TABLET    Take 1 tablet (800 mg total) by mouth 3 (three) times daily.     Cy Blamer, MD 05/20/16 [phone]

## 2017-04-10 ENCOUNTER — Other Ambulatory Visit: Payer: Self-pay

## 2017-04-10 ENCOUNTER — Encounter (HOSPITAL_BASED_OUTPATIENT_CLINIC_OR_DEPARTMENT_OTHER): Payer: Self-pay | Admitting: Emergency Medicine

## 2017-04-10 ENCOUNTER — Emergency Department (HOSPITAL_BASED_OUTPATIENT_CLINIC_OR_DEPARTMENT_OTHER)
Admission: EM | Admit: 2017-04-10 | Discharge: 2017-04-10 | Disposition: A | Discharge status: Home / Self Care | Payer: 59 | Attending: Emergency Medicine | Admitting: Emergency Medicine

## 2017-04-10 ENCOUNTER — Emergency Department (HOSPITAL_BASED_OUTPATIENT_CLINIC_OR_DEPARTMENT_OTHER): Payer: 59

## 2017-04-10 DIAGNOSIS — M25562 Pain in left knee: Secondary | ICD-10-CM | POA: Insufficient documentation

## 2017-04-10 DIAGNOSIS — G8929 Other chronic pain: Secondary | ICD-10-CM | POA: Diagnosis not present

## 2017-04-10 DIAGNOSIS — Z79899 Other long term (current) drug therapy: Secondary | ICD-10-CM | POA: Diagnosis not present

## 2017-04-10 DIAGNOSIS — Z87891 Personal history of nicotine dependence: Secondary | ICD-10-CM | POA: Insufficient documentation

## 2017-04-10 DIAGNOSIS — I10 Essential (primary) hypertension: Secondary | ICD-10-CM | POA: Insufficient documentation

## 2017-04-10 NOTE — Discharge Instructions (Signed)
Please read instructions below. Apply ice to your knee for 20 minutes at a time. You can take advil/ibuprofen every 6 hours as needed for pain. OR you can take aleve every 12 hours. Schedule an appointment with your orthopedic specialist to follow-up on your ongoing knee pain. Return to the ER for new or concerning symptoms.

## 2017-04-10 NOTE — ED Provider Notes (Signed)
[facility] EMERGENCY DEPARTMENT Provider Note   CSN: [id number] Arrival date & time: 04/10/17  1506     History   Chief Complaint Chief Complaint  Patient presents with  . Knee Pain    HPI Russell Henderson is a 47 y.o. male with past medical history of hypertension and left meniscal injury status post arthroscopy, presenting to the ED with worsening left knee pain since Friday.  Patient denies new injuries or fall.  He states he has had 2 meniscal repair surgeries in the past, [facility]  He states he was scheduled to have a third, however had insurance issues and is that done.  States he attempted to have appointment with Dr. Jillyn HiddenBean today, however was unable to.  States pain is typical of his left knee pain, worse in the posterior aspect, and aggravated by ambulation and movement.  States he uses a cane ambulate.  Has not taken any over-the-counter medications for his symptoms.  The history is provided by the patient.    Past Medical History:  Diagnosis Date  . Hypertension     There are no active problems to display for this patient.   Past Surgical History:  Procedure Laterality Date  . ANKLE SURGERY    . KNEE ARTHROSCOPY         Home Medications    Prior to Admission medications   Medication Sig Start Date End Date Taking? Authorizing Provider  clindamycin (CLEOCIN) 300 MG capsule Take 1 capsule (300 mg total) by mouth 4 (four) times daily. X 7 days 05/20/16   Palumbo, April, MD  ibuprofen (ADVIL,MOTRIN) 800 MG tablet Take 1 tablet (800 mg total) by mouth 3 (three) times daily. 05/20/16   Palumbo, April, MD  lisinopril (PRINIVIL,ZESTRIL) 5 MG tablet Take 5 mg by mouth daily.    [provider]    Family History History reviewed. No pertinent family history.  Social History Social History   Tobacco Use  . Smoking status: Former Smoker    Packs/day: 0.50    Types: Cigars  . Smokeless tobacco: Never  Used  Substance Use Topics  . Alcohol use: No  . Drug use: No     Allergies   Patient has no known allergies.   Review of Systems Review of Systems  Musculoskeletal: Positive for arthralgias. Negative for joint swelling.  Skin: Negative for color change.     Physical Exam Updated Vital Signs BP (!) 179/125 (BP Location: Left Arm)   Pulse 85   Temp 98.7 F (37.1 C) (Oral)   Resp 18   Ht 6' (1.829 m)   Wt 85.3 kg (188 lb)   SpO2 100%   BMI 25.50 kg/m   Physical Exam  Constitutional: He appears well-developed and well-nourished. No distress.  HENT:  Head: Normocephalic and atraumatic.  Eyes: Conjunctivae are normal.  Cardiovascular: Normal rate and intact distal pulses.  Pulmonary/Chest: Effort normal.  Musculoskeletal:  Left knee with old vertical surgical scar over anterior aspect.  No edema, erythema or warmth to the joint.  TTP over bilateral joint line and posteriorly.  No crepitus with normal range of motion.  Psychiatric: He has a normal mood and affect. His behavior is normal.  Nursing note and vitals reviewed.    ED Treatments / Results  Labs (all labs ordered are listed, but only abnormal results are displayed) Labs Reviewed - No data to display  EKG  EKG Interpretation None       Radiology  Dg Knee Complete 4 Views Left  Result Date: 04/10/2017 CLINICAL DATA:  Knee pain and swelling.  No injury. EXAM: LEFT KNEE - COMPLETE 4+ VIEW COMPARISON:  None. FINDINGS: No acute fracture or dislocation. Mild tricompartmental compartment joint space narrowing. No significant joint effusion. Bipartite patella. Bone mineralization is normal. Soft tissues are unremarkable. IMPRESSION: 1. No acute osseous abnormality. Mild tricompartmental osteoarthritis. Electronically Signed   By: Obie Dredge M.D.   On: 04/10/2017 15:35    Procedures Procedures (including critical care time)  Medications Ordered in ED Medications - No data to display   Initial  Impression / Assessment and Plan / ED Course  I have reviewed the triage vital signs and the nursing notes.  Pertinent labs & imaging results that were available during my care of the patient were reviewed by me and considered in my medical decision making (see chart for details).     Patient is to have left meniscal injury, presenting to the ED with persistent chronic left knee pain.  X-ray done in triage is negative for acute pathology, however showing tricompartmental osteoarthritis.  Pt advised to follow up with his orthopedic provider. Conservative therapy recommended and discussed. Patient will be dc home & is agreeable with above plan.  Discussed results, findings, treatment and follow up. Patient advised of return precautions. Patient verbalized understanding and agreed with plan.  Final Clinical Impressions(s) / ED Diagnoses   Final diagnoses:  Chronic pain of left knee    ED Discharge Orders    None       Robinson, [place] N, PA-C 04/10/17 1747    Little, Ambrose [place], MD 04/11/17 [phone]

## 2017-04-10 NOTE — ED Triage Notes (Signed)
Patient states that he is having pain to his left knee. He has had a torn meniscus.

## 2019-05-13 ENCOUNTER — Other Ambulatory Visit: Payer: Self-pay

## 2019-05-13 ENCOUNTER — Emergency Department (HOSPITAL_BASED_OUTPATIENT_CLINIC_OR_DEPARTMENT_OTHER): Payer: 59

## 2019-05-13 ENCOUNTER — Encounter (HOSPITAL_BASED_OUTPATIENT_CLINIC_OR_DEPARTMENT_OTHER): Payer: Self-pay | Admitting: *Deleted

## 2019-05-13 ENCOUNTER — Emergency Department (HOSPITAL_BASED_OUTPATIENT_CLINIC_OR_DEPARTMENT_OTHER)
Admission: EM | Admit: 2019-05-13 | Discharge: 2019-05-13 | Disposition: A | Discharge status: Home / Self Care | Payer: 59 | Attending: Emergency Medicine | Admitting: Emergency Medicine

## 2019-05-13 DIAGNOSIS — Z79899 Other long term (current) drug therapy: Secondary | ICD-10-CM | POA: Diagnosis not present

## 2019-05-13 DIAGNOSIS — Z87891 Personal history of nicotine dependence: Secondary | ICD-10-CM | POA: Insufficient documentation

## 2019-05-13 DIAGNOSIS — M79605 Pain in left leg: Secondary | ICD-10-CM

## 2019-05-13 DIAGNOSIS — I10 Essential (primary) hypertension: Secondary | ICD-10-CM | POA: Diagnosis not present

## 2019-05-13 MED ORDER — DICLOFENAC SODIUM 75 MG PO TBEC: 75.0000 mg | DELAYED_RELEASE_TABLET | Freq: Two times a day (BID) | ORAL | 0 refills | Status: AC

## 2019-05-13 NOTE — Discharge Instructions (Addendum)
See your Physician for recheck.  Return if any problems.  °

## 2019-05-13 NOTE — ED Notes (Signed)
Pt. Reports he feels like he has a cramp in the L calf all the time for the last 2 weeks and no trouble breathing and no cough.  Pt. Reports his b/p is always high even with his meds.

## 2019-05-13 NOTE — ED Triage Notes (Signed)
Pt. Reports L leg pain started 2 weeks ago behind the L knee and behind the L calf.  Pt. Reports no injury to the L leg or L knee.

## 2019-05-13 NOTE — ED Notes (Signed)
Pt. Is back from radiology.  Pt. Has been up and down from room to restroom.

## 2019-05-15 NOTE — ED Provider Notes (Signed)
[facility] EMERGENCY DEPARTMENT Provider Note   CSN: [id number] Arrival date & time: 05/13/19  2048     History Chief Complaint  Patient presents with  . Leg Pain    Russell Henderson is a 49 y.o. male.  The history is provided by the patient. No language interpreter was used.  Leg Pain Location:  Leg Leg location:  L leg Pain details:    Quality:  Aching   Severity:  Moderate   Onset quality:  Gradual   Timing:  Constant   Progression:  Worsening Prior injury to area:  No Relieved by:  Nothing Worsened by:  Nothing Ineffective treatments:  None tried Associated symptoms: no numbness   Risk factors: no recent illness   Pt complains of pain to his calf and the back of his leg..  Pt has had knee surgery.  Pt reports knee joint feels fine.      Past Medical History:  Diagnosis Date  . Hypertension     There are no problems to display for this patient.   Past Surgical History:  Procedure Laterality Date  . ANKLE SURGERY    . KNEE ARTHROSCOPY         No family history on file.  Social History   Tobacco Use  . Smoking status: Former Smoker    Packs/day: 0.50    Types: Cigars  . Smokeless tobacco: Never Used  Substance Use Topics  . Alcohol use: No  . Drug use: No    Home Medications Prior to Admission medications   Medication Sig Start Date End Date Taking? Authorizing Provider  amlodipine-atorvastatin (CADUET) 10-10 MG tablet Take 1 tablet by mouth daily.   Yes [provider]  ibuprofen (ADVIL,MOTRIN) 800 MG tablet Take 1 tablet (800 mg total) by mouth 3 (three) times daily. 05/20/16  Yes Palumbo, April, MD  lisinopril (PRINIVIL,ZESTRIL) 5 MG tablet Take 5 mg by mouth daily.   Yes [provider]  omeprazole (PRILOSEC) 20 MG capsule Take 20 mg by mouth daily.   Yes [provider]  clindamycin (CLEOCIN) 300 MG capsule Take 1 capsule (300 mg total) by mouth 4 (four) times daily. X 7 days 05/20/16   Nicanor Alcon, April,  MD  diclofenac (VOLTAREN) 75 MG EC tablet Take 1 tablet (75 mg total) by mouth 2 (two) times daily. 05/13/19   Elson Areas, PA-C    Allergies    Cortisone  Review of Systems   Review of Systems  Musculoskeletal: Positive for myalgias. Negative for joint swelling.  All other systems reviewed and are negative.   Physical Exam Updated Vital Signs BP (!) 169/123 (BP Location: Left Arm)   Pulse 99   Temp 98.3 F (36.8 C) (Oral)   Resp 16   Ht 6' (1.829 m)   Wt 81.6 kg   SpO2 100%   BMI 24.41 kg/m   Physical Exam Vitals and nursing note reviewed.  Constitutional:      Appearance: He is well-developed.  HENT:     Head: Normocephalic and atraumatic.  Eyes:     Conjunctiva/sclera: Conjunctivae normal.  Cardiovascular:     Rate and Rhythm: Normal rate.     Heart sounds: No murmur.  Pulmonary:     Effort: Pulmonary effort is normal. No respiratory distress.  Abdominal:     Tenderness: There is no abdominal tenderness.  Musculoskeletal:        General: Tenderness present. No swelling, deformity or signs of injury.     Comments:  Tender left calf muscle,  Positive homan's, knee joint nontender nv and ns intact  Skin:    General: Skin is warm and dry.  Neurological:     General: No focal deficit present.     Mental Status: He is alert.  Psychiatric:        Mood and Affect: Mood normal.     ED Results / Procedures / Treatments   Labs (all labs ordered are listed, but only abnormal results are displayed) Labs Reviewed - No data to display  EKG None  Radiology US Venous Img Lower Unilateral Left  Result Date: 05/13/2019 CLINICAL DATA:  Pain. EXAM: RIGHT LOWER EXTREMITY VENOUS DOPPLER ULTRASOUND TECHNIQUE: Gray-scale sonography with graded compression, as well as color Doppler and duplex ultrasound were performed to evaluate the lower extremity deep venous systems from the level of the common femoral vein and including the common femoral, femoral, profunda femoral,  popliteal and calf veins including the posterior tibial, peroneal and gastrocnemius veins when visible. The superficial great saphenous vein was also interrogated. Spectral Doppler was utilized to evaluate flow at rest and with distal augmentation maneuvers in the common femoral, femoral and popliteal veins. COMPARISON:  None. FINDINGS: Contralateral Common Femoral Vein: Respiratory phasicity is normal and symmetric with the symptomatic side. No evidence of thrombus. Normal compressibility. Common Femoral Vein: No evidence of thrombus. Normal compressibility, respiratory phasicity and response to augmentation. Saphenofemoral Junction: No evidence of thrombus. Normal compressibility and flow on color Doppler imaging. Profunda Femoral Vein: No evidence of thrombus. Normal compressibility and flow on color Doppler imaging. Femoral Vein: No evidence of thrombus. Normal compressibility, respiratory phasicity and response to augmentation. Popliteal Vein: No evidence of thrombus. Normal compressibility, respiratory phasicity and response to augmentation. Calf Veins: No evidence of thrombus. Normal compressibility and flow on color Doppler imaging. Superficial Great Saphenous Vein: No evidence of thrombus. Normal compressibility. Other Findings:  None. IMPRESSION: No evidence of deep venous thrombosis. Electronically Signed   By: Constance Holster M.D.   On: 05/13/2019 22:01    Procedures Procedures (including critical care time)  Medications Ordered in ED Medications - No data to display  ED Course  I have reviewed the triage vital signs and the nursing notes.  Pertinent labs & imaging results that were available during my care of the patient were reviewed by me and considered in my medical decision making (see chart for details).    MDM Rules/Calculators/A&P                      MDM  Ultrasound normal  Pt counseled on possible muscle strain  Final Clinical Impression(s) / ED Diagnoses Final diagnoses:   Left leg pain    Rx / DC Orders ED Discharge Orders         Ordered    diclofenac (VOLTAREN) 75 MG EC tablet  2 times daily     05/13/19 2227        An After Visit Summary was printed and given to the patient.    Fransico Meadow, [place] 05/15/19 1657    Margette Fast, MD 05/19/19 1510

## 2020-08-29 ENCOUNTER — Emergency Department (HOSPITAL_BASED_OUTPATIENT_CLINIC_OR_DEPARTMENT_OTHER): Payer: BLUE CROSS/BLUE SHIELD

## 2020-08-29 ENCOUNTER — Emergency Department (HOSPITAL_BASED_OUTPATIENT_CLINIC_OR_DEPARTMENT_OTHER)
Admission: EM | Admit: 2020-08-29 | Discharge: 2020-08-29 | Disposition: A | Discharge status: Home / Self Care | Payer: BLUE CROSS/BLUE SHIELD | Attending: Emergency Medicine | Admitting: Emergency Medicine

## 2020-08-29 ENCOUNTER — Other Ambulatory Visit: Payer: Self-pay

## 2020-08-29 ENCOUNTER — Encounter (HOSPITAL_BASED_OUTPATIENT_CLINIC_OR_DEPARTMENT_OTHER): Payer: Self-pay

## 2020-08-29 DIAGNOSIS — Z79899 Other long term (current) drug therapy: Secondary | ICD-10-CM | POA: Insufficient documentation

## 2020-08-29 DIAGNOSIS — U071 COVID-19: Secondary | ICD-10-CM | POA: Insufficient documentation

## 2020-08-29 DIAGNOSIS — Z28310 Unvaccinated for covid-19: Secondary | ICD-10-CM | POA: Insufficient documentation

## 2020-08-29 DIAGNOSIS — I10 Essential (primary) hypertension: Secondary | ICD-10-CM | POA: Insufficient documentation

## 2020-08-29 DIAGNOSIS — Z87891 Personal history of nicotine dependence: Secondary | ICD-10-CM | POA: Insufficient documentation

## 2020-08-29 DIAGNOSIS — R509 Fever, unspecified: Secondary | ICD-10-CM

## 2020-08-29 LAB — RESP PANEL BY RT-PCR (FLU A&B, COVID) ARPGX2
Influenza A by PCR: NEGATIVE
Influenza B by PCR: NEGATIVE
SARS Coronavirus 2 by RT PCR: POSITIVE — AB

## 2020-08-29 LAB — CBC WITH DIFFERENTIAL/PLATELET
Abs Immature Granulocytes: 0.03 10*3/uL (ref 0.00–0.07)
Basophils Absolute: 0 10*3/uL (ref 0.0–0.1)
Basophils Relative: 0 %
Eosinophils Absolute: 0.1 10*3/uL (ref 0.0–0.5)
Eosinophils Relative: 1 %
HCT: 38.6 % — ABNORMAL LOW (ref 39.0–52.0)
Hemoglobin: 13.3 g/dL (ref 13.0–17.0)
Immature Granulocytes: 0 %
Lymphocytes Relative: 4 %
Lymphs Abs: 0.4 10*3/uL — ABNORMAL LOW (ref 0.7–4.0)
MCH: 32.3 pg (ref 26.0–34.0)
MCHC: 34.5 g/dL (ref 30.0–36.0)
MCV: 93.7 fL (ref 80.0–100.0)
Monocytes Absolute: 0.8 10*3/uL (ref 0.1–1.0)
Monocytes Relative: 8 %
Neutro Abs: 8.3 10*3/uL — ABNORMAL HIGH (ref 1.7–7.7)
Neutrophils Relative %: 87 %
Platelets: 229 10*3/uL (ref 150–400)
RBC: 4.12 MIL/uL — ABNORMAL LOW (ref 4.22–5.81)
RDW: 13.2 % (ref 11.5–15.5)
WBC: 9.7 10*3/uL (ref 4.0–10.5)
nRBC: 0 % (ref 0.0–0.2)

## 2020-08-29 LAB — BASIC METABOLIC PANEL
Anion gap: 7 (ref 5–15)
BUN: 18 mg/dL (ref 6–20)
CO2: 24 mmol/L (ref 22–32)
Calcium: 8.8 mg/dL — ABNORMAL LOW (ref 8.9–10.3)
Chloride: 106 mmol/L (ref 98–111)
Creatinine, Ser: 1.68 mg/dL — ABNORMAL HIGH (ref 0.61–1.24)
GFR, Estimated: 50 mL/min — ABNORMAL LOW (ref >60)
Glucose, Bld: 108 mg/dL — ABNORMAL HIGH (ref 70–99)
Potassium: 3.9 mmol/L (ref 3.5–5.1)
Sodium: 137 mmol/L (ref 135–145)

## 2020-08-29 LAB — LACTIC ACID, PLASMA: Lactic Acid, Venous: 1.6 mmol/L (ref 0.5–1.9)

## 2020-08-29 MED ORDER — IPRATROPIUM-ALBUTEROL 0.5-2.5 (3) MG/3ML IN SOLN
3.0000 mL | RESPIRATORY_TRACT | Status: AC
  Administered 2020-08-29: 3 mL via RESPIRATORY_TRACT

## 2020-08-29 MED ORDER — IPRATROPIUM-ALBUTEROL 0.5-2.5 (3) MG/3ML IN SOLN
RESPIRATORY_TRACT | Status: AC
  Filled 2020-08-29: qty 3

## 2020-08-29 MED ORDER — KETOROLAC TROMETHAMINE 30 MG/ML IJ SOLN
30.0000 mg | Freq: Once | INTRAMUSCULAR | Status: AC
  Administered 2020-08-29: 30 mg via INTRAVENOUS
  Filled 2020-08-29: qty 1

## 2020-08-29 MED ORDER — ONDANSETRON HCL 4 MG PO TABS: 4.0000 mg | ORAL_TABLET | Freq: Three times a day (TID) | ORAL | 0 refills | Status: AC | PRN

## 2020-08-29 MED ORDER — ACETAMINOPHEN 500 MG PO TABS
1000.0000 mg | ORAL_TABLET | Freq: Once | ORAL | Status: AC
  Administered 2020-08-29: 1000 mg via ORAL
  Filled 2020-08-29: qty 2

## 2020-08-29 NOTE — ED Provider Notes (Signed)
Patient signed out to me by previous provider.  Please refer to their note for full HPI.  Briefly this is a 50 year old male who presented to the emergency department with fever and headache.  Patient has been treated symptomatically, work-up otherwise is very reassuring, high suspicion for COVID infection, COVID testing is pending. Physical Exam  BP (!) 164/100   Pulse 91   Temp 100 F (37.8 C) (Oral)   Resp 18   Ht 6' (1.829 m)   Wt 83.9 kg   SpO2 100%   BMI 25.09 kg/m   Physical Exam Vitals and nursing note reviewed.  Constitutional:      General: He is not in acute distress.    Appearance: Normal appearance. He is not ill-appearing, toxic-appearing or diaphoretic.  HENT:     Head: Normocephalic.     Mouth/Throat:     Mouth: Mucous membranes are moist.  Cardiovascular:     Rate and Rhythm: Normal rate.  Pulmonary:     Effort: Pulmonary effort is normal. No respiratory distress.  Abdominal:     Palpations: Abdomen is soft.     Tenderness: There is no abdominal tenderness.  Musculoskeletal:     Cervical back: No rigidity.  Skin:    General: Skin is warm.  Neurological:     Mental Status: He is alert and oriented to person, place, and time. Mental status is at baseline.  Psychiatric:        Mood and Affect: Mood normal.    ED Course/Procedures     Procedures  MDM   COVID swab is positive.  We believe this is what is contributing to the symptoms.  No signs of meningitis.  Vitals are stable.  Patient is otherwise well-appearing.  He has past medical history of hypertension but otherwise does not appear to be high risk for severe COVID infection/hospitalization.  I had a shared discussion with the patient about Paxlovid and other treatments for COVID.  At this time he declines and would like to treat himself symptomatically at home.  Educated the patient on Covid precautions, treatments and expectations.  Patient is stable for discharge and treatment as an outpatient.  Patient agrees with the discharge plan/strict return precautions and verbalizes understanding.       Rozelle Logan, [place] 08/29/20 8756

## 2020-08-29 NOTE — Discharge Instructions (Addendum)
You have been evaluated in the Emergency Department. You are Covid positive.  You are to isolate and quarantine yourself.  Please refer to the information attached with this packet.  Treat yourself symptomatically with over the counter medication as you would for a viral illness.  Take Tylenol and Ibuprofen for pain and fever control. Stay well hydrated. If you have any worsening or severe symptoms, difficulty breathing, concern for your health please return to the emergency department. 

## 2020-08-29 NOTE — ED Provider Notes (Signed)
[facility] EMERGENCY DEPARTMENT Provider Note   CSN: [id number] Arrival date & time: 08/29/20  0516     History Chief Complaint  Patient presents with   Fever   Headache    Russell Henderson is a 50 y.o. male.  Patient reports that he woke up this morning with a headache and a fever.  He went to bed feeling well.  He has not had any sore throat, nasal congestion, cough, nausea, vomiting, diarrhea.  He does not have any known sick contacts.  He has not been vaccinated for COVID, did have COVID infection 1 year ago.      Past Medical History:  Diagnosis Date   Hypertension     There are no problems to display for this patient.   Past Surgical History:  Procedure Laterality Date   ANKLE SURGERY     KNEE ARTHROSCOPY         History reviewed. No pertinent family history.  Social History   Tobacco Use   Smoking status: Former    Packs/day: 0.50    Types: Cigars, Cigarettes   Smokeless tobacco: Never  Substance Use Topics   Alcohol use: No   Drug use: No    Home Medications Prior to Admission medications   Medication Sig Start Date End Date Taking? Authorizing Provider  amlodipine-atorvastatin (CADUET) 10-10 MG tablet Take 1 tablet by mouth daily.    [provider]  clindamycin (CLEOCIN) 300 MG capsule Take 1 capsule (300 mg total) by mouth 4 (four) times daily. X 7 days 05/20/16   Nicanor Alcon, April, MD  diclofenac (VOLTAREN) 75 MG EC tablet Take 1 tablet (75 mg total) by mouth 2 (two) times daily. 05/13/19   Elson Areas, PA-C  ibuprofen (ADVIL,MOTRIN) 800 MG tablet Take 1 tablet (800 mg total) by mouth 3 (three) times daily. 05/20/16   Palumbo, April, MD  lisinopril (PRINIVIL,ZESTRIL) 5 MG tablet Take 5 mg by mouth daily.    [provider]  omeprazole (PRILOSEC) 20 MG capsule Take 20 mg by mouth daily.    [provider]    Allergies    Cortisone  Review of Systems   Review of Systems  Constitutional:  Positive for  fever.  Neurological:  Positive for headaches.  All other systems reviewed and are negative.  Physical Exam Updated Vital Signs BP (!) 187/111 (BP Location: Left Arm)   Pulse 98   Temp 100 F (37.8 C) (Oral)   Resp 20   Ht 6' (1.829 m)   Wt 83.9 kg   SpO2 100%   BMI 25.09 kg/m   Physical Exam Vitals and nursing note reviewed.  Constitutional:      General: He is not in acute distress.    Appearance: Normal appearance. He is well-developed.  HENT:     Head: Normocephalic and atraumatic.     Right Ear: Hearing normal.     Left Ear: Hearing normal.     Nose: Nose normal.  Eyes:     Conjunctiva/sclera: Conjunctivae normal.     Pupils: Pupils are equal, round, and reactive to light.  Neck:     Meningeal: Brudzinski's sign and Kernig's sign absent.  Cardiovascular:     Rate and Rhythm: Regular rhythm.     Heart sounds: S1 normal and S2 normal. No murmur heard.   No friction rub. No gallop.  Pulmonary:     Effort: Pulmonary effort is normal. No respiratory distress.     Breath sounds: Normal breath sounds.  Chest:     Chest wall: No tenderness.  Abdominal:     General: Bowel sounds are normal.     Palpations: Abdomen is soft.     Tenderness: There is no abdominal tenderness. There is no guarding or rebound. Negative signs include Murphy's sign and McBurney's sign.     Hernia: No hernia is present.  Musculoskeletal:        General: Normal range of motion.     Cervical back: Normal range of motion and neck supple. No rigidity.  Skin:    General: Skin is warm and dry.     Findings: No rash.  Neurological:     Mental Status: He is alert and oriented to person, place, and time.     GCS: GCS eye subscore is 4. GCS verbal subscore is 5. GCS motor subscore is 6.     Cranial Nerves: No cranial nerve deficit.     Sensory: No sensory deficit.     Coordination: Coordination normal.  Psychiatric:        Speech: Speech normal.        Behavior: Behavior normal.        Thought  Content: Thought content normal.    ED Results / Procedures / Treatments   Labs (all labs ordered are listed, but only abnormal results are displayed) Labs Reviewed  CBC WITH DIFFERENTIAL/PLATELET - Abnormal; Notable for the following components:      Result Value   RBC 4.12 (*)    HCT 38.6 (*)    Neutro Abs 8.3 (*)    Lymphs Abs 0.4 (*)    All other components within normal limits  BASIC METABOLIC PANEL - Abnormal; Notable for the following components:   Glucose, Bld 108 (*)    Creatinine, Ser 1.68 (*)    Calcium 8.8 (*)    GFR, Estimated 50 (*)    All other components within normal limits  CULTURE, BLOOD (SINGLE)  RESP PANEL BY RT-PCR (FLU A&B, COVID) ARPGX2  LACTIC ACID, PLASMA    EKG None  Radiology DG Chest Port 1 View  Result Date: 08/29/2020 CLINICAL DATA:  49 year old male with fever and headache since 0200 hours. EXAM: PORTABLE CHEST 1 VIEW COMPARISON:  None. FINDINGS: Portable AP view at 0601 hours. Lung volumes and mediastinal contours are normal. Visualized tracheal air column is within normal limits. Allowing for portable technique the lungs are clear. No pneumothorax or pleural effusion. No osseous abnormality identified. IMPRESSION: Negative portable chest. Electronically Signed   By: Odessa Fleming M.D.   On: 08/29/2020 06:31    Procedures Procedures   Medications Ordered in ED Medications  ketorolac (TORADOL) 30 MG/ML injection 30 mg (30 mg Intravenous Given 08/29/20 0617)  acetaminophen (TYLENOL) tablet 1,000 mg (1,000 mg Oral Given 08/29/20 0617)  ipratropium-albuterol (DUONEB) 0.5-2.5 (3) MG/3ML nebulizer solution 3 mL ( Nebulization Canceled Entry 08/29/20 3557)    ED Course  I have reviewed the triage vital signs and the nursing notes.  Pertinent labs & imaging results that were available during my care of the patient were reviewed by me and considered in my medical decision making (see chart for details).    MDM Rules/Calculators/A&P                           Presents with headache and fever.  He appears well.  No meningismus.  No other complaints to explain the fever.  He is not vaccinated for COVID.  Will  check COVID swab.  Chest x-ray clear.  White blood cell count normal.  Lactic acid normal.  No signs of sepsis.  Will sign out to oncoming ER physician to follow-up on COVID swab prior to disposition.  Anticipate discharge.  Final Clinical Impression(s) / ED Diagnoses Final diagnoses:  Febrile illness    Rx / DC Orders ED Discharge Orders     None        Ensley Blas, Canary Brim, MD 08/29/20 [phone]

## 2020-08-29 NOTE — ED Triage Notes (Signed)
Fever and headache since around 02.00 this morning.

## 2020-09-03 LAB — CULTURE, BLOOD (SINGLE)
Culture: NO GROWTH
Special Requests: ADEQUATE

## 2020-10-17 IMAGING — US US EXTREM LOW VENOUS*L*
1 series · 13 of 24 positions shown · non-contrast
Comparison: None.

CLINICAL DATA: Pain.



[Series 1: us extrem low venous*left* · 13 of 33 slices shown]
[im 1/33]
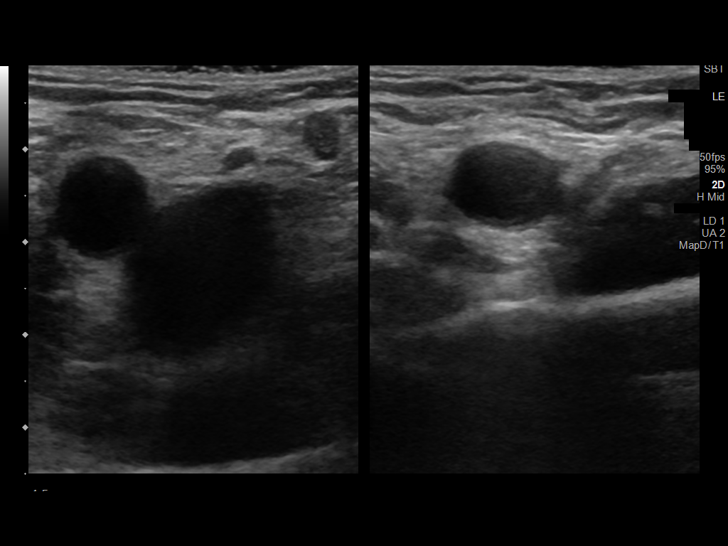
[im 3/33]
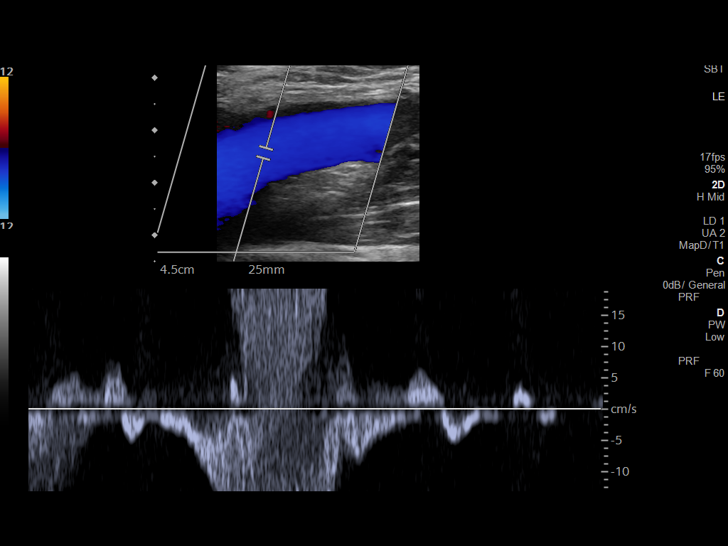
[im 6/33]
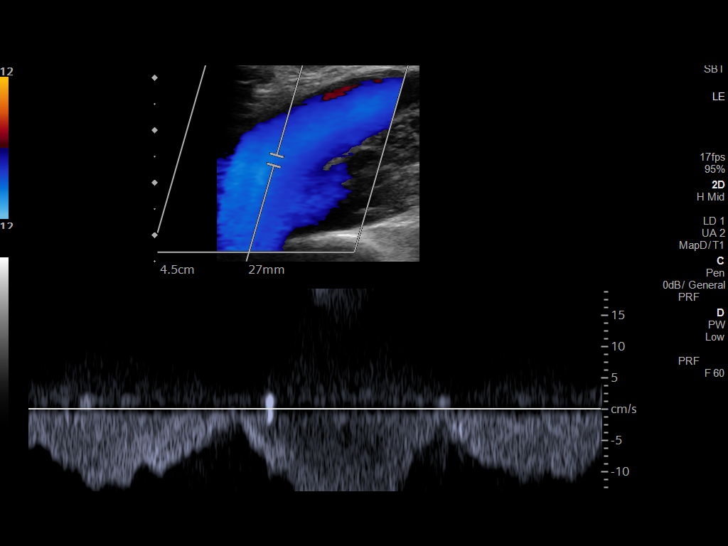
[im 9/33]
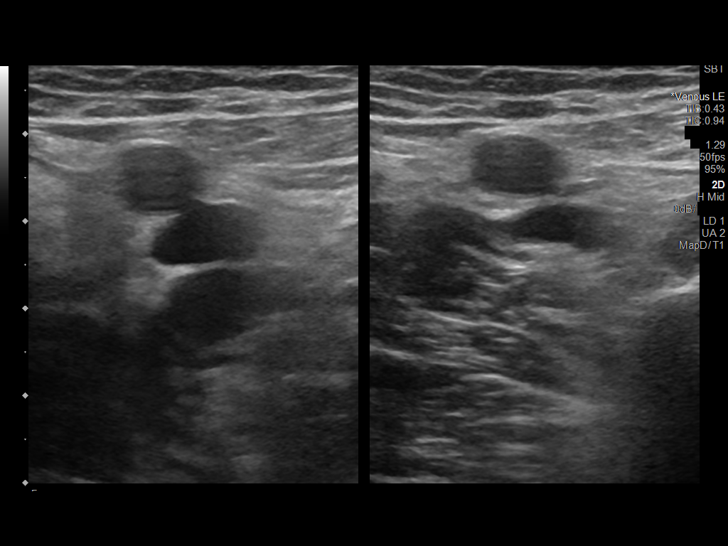
[im 12/33]
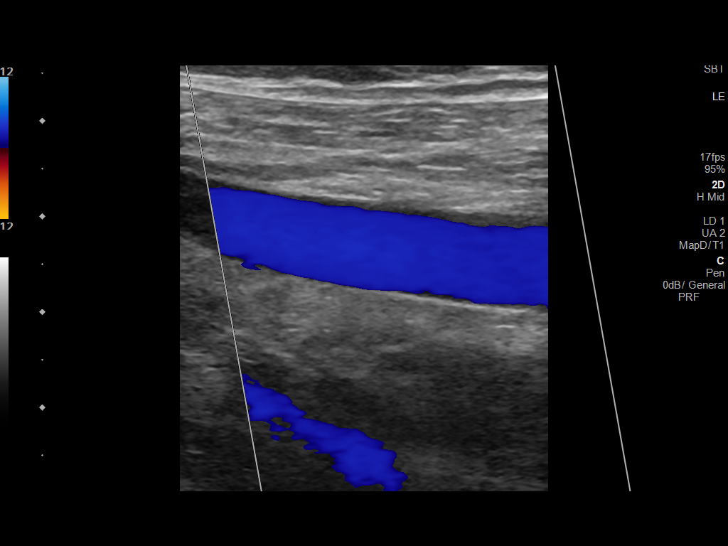
[im 14/33]
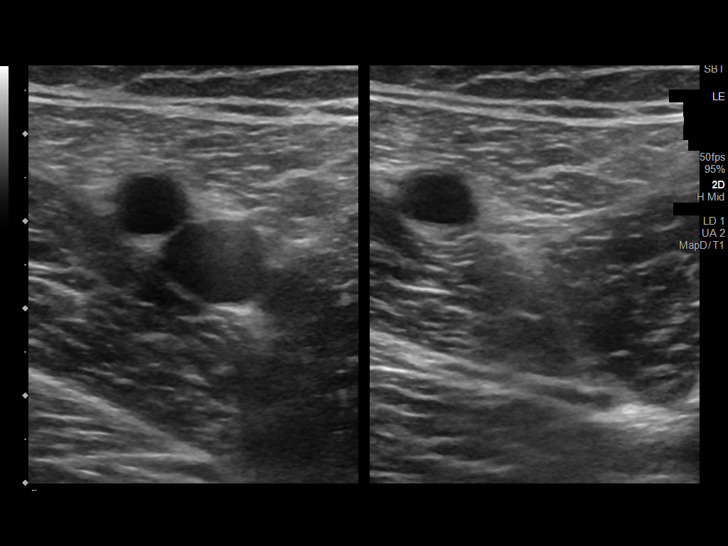
[im 17/33]
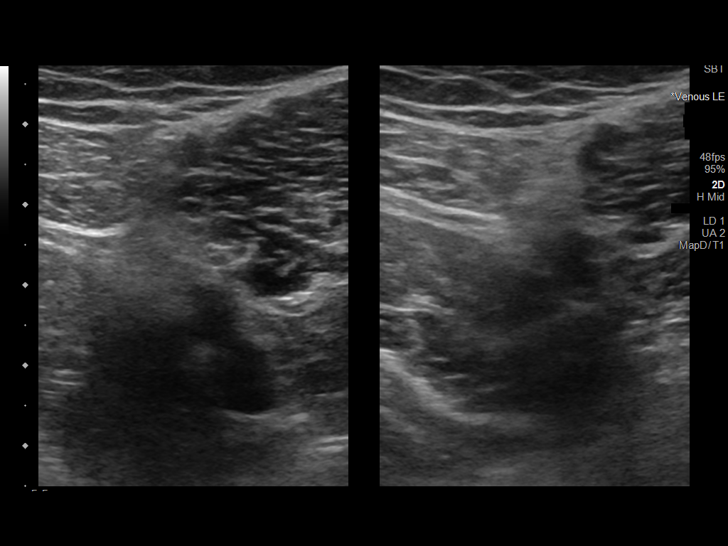
[im 19/33]
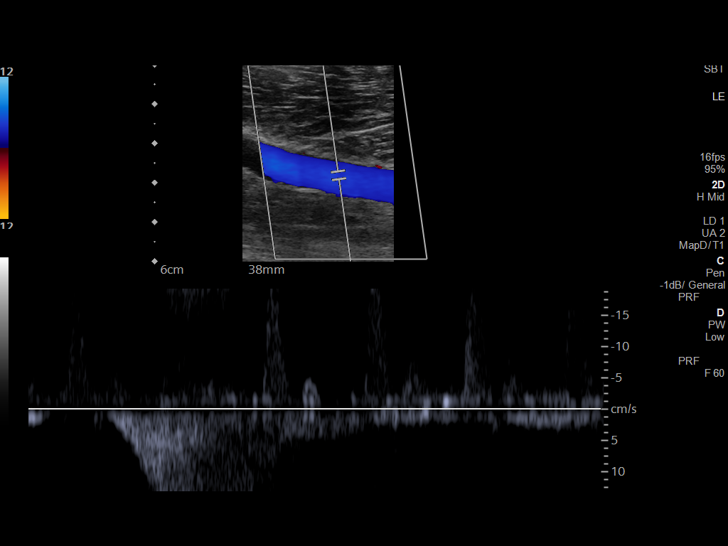
[im 21/33]
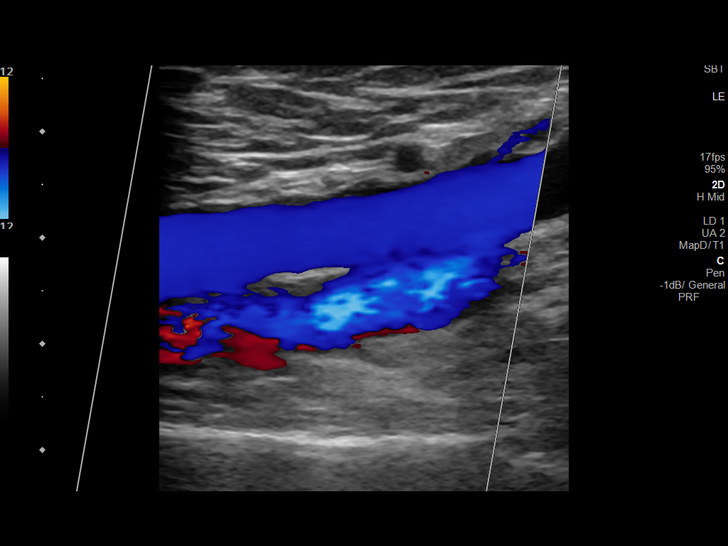
[im 24/33]
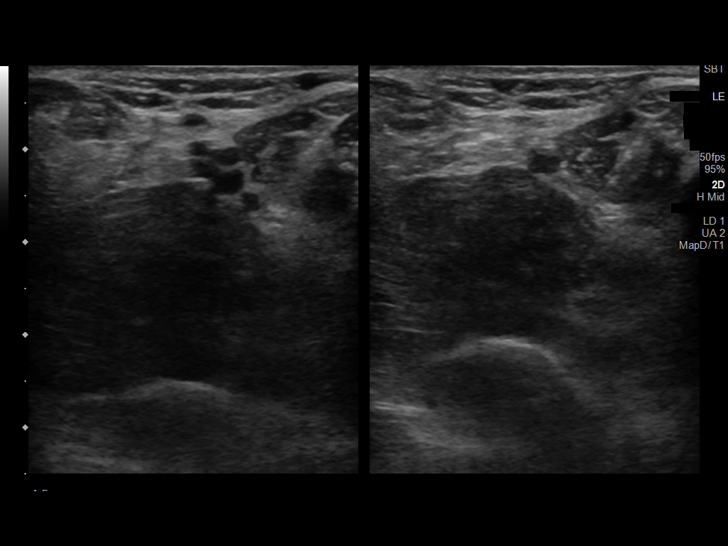
[im 27/33]
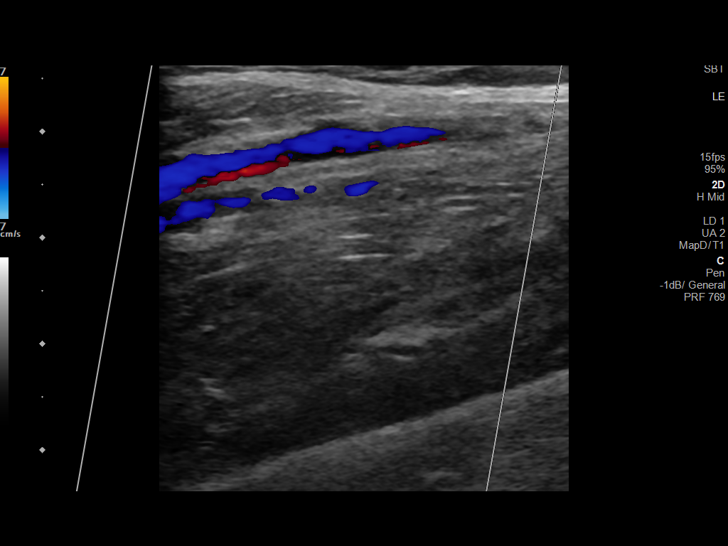
[im 30/33]
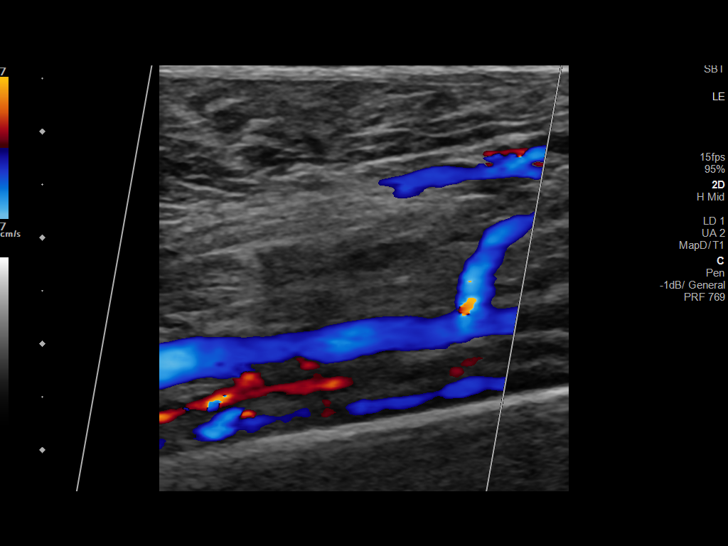
[im 33/33]
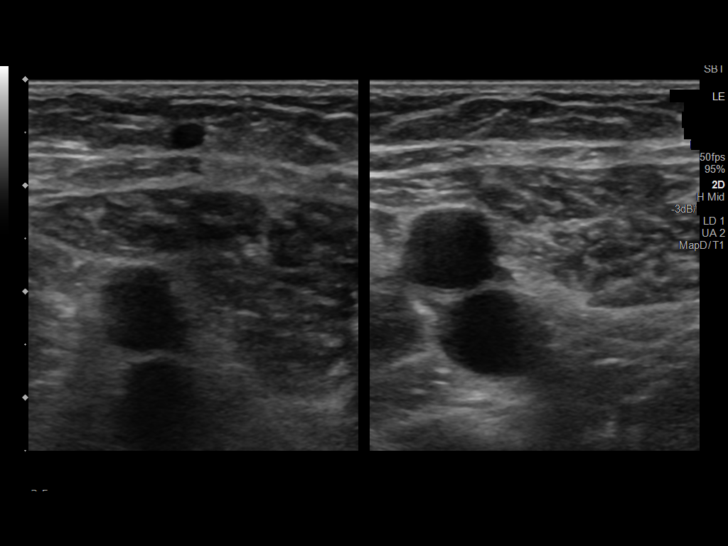

[13 of 24 positions shown; findings below may reference images not displayed]

FINDINGS: Contralateral Common Femoral Vein: Respiratory phasicity is normal
and symmetric with the symptomatic side. No evidence of thrombus.
Normal compressibility.

Common Femoral Vein: No evidence of thrombus. Normal
compressibility, respiratory phasicity and response to augmentation.

Saphenofemoral Junction: No evidence of thrombus. Normal
compressibility and flow on color Doppler imaging.

Profunda Femoral Vein: No evidence of thrombus. Normal
compressibility and flow on color Doppler imaging.

Femoral Vein: No evidence of thrombus. Normal compressibility,
respiratory phasicity and response to augmentation.

Popliteal Vein: No evidence of thrombus. Normal compressibility,
respiratory phasicity and response to augmentation.

Calf Veins: No evidence of thrombus. Normal compressibility and flow
on color Doppler imaging.

Superficial Great Saphenous Vein: No evidence of thrombus. Normal
compressibility.

Other Findings:  None.
IMPRESSION: No evidence of deep venous thrombosis.

## 2021-11-07 ENCOUNTER — Emergency Department (HOSPITAL_BASED_OUTPATIENT_CLINIC_OR_DEPARTMENT_OTHER)
Admission: EM | Admit: 2021-11-07 | Discharge: 2021-11-07 | Disposition: A | Discharge status: Home / Self Care | Payer: BC Managed Care – PPO | Attending: Emergency Medicine | Admitting: Emergency Medicine

## 2021-11-07 ENCOUNTER — Other Ambulatory Visit: Payer: Self-pay

## 2021-11-07 ENCOUNTER — Encounter (HOSPITAL_BASED_OUTPATIENT_CLINIC_OR_DEPARTMENT_OTHER): Payer: Self-pay | Admitting: Emergency Medicine

## 2021-11-07 DIAGNOSIS — Z20822 Contact with and (suspected) exposure to covid-19: Secondary | ICD-10-CM | POA: Diagnosis not present

## 2021-11-07 DIAGNOSIS — Z5321 Procedure and treatment not carried out due to patient leaving prior to being seen by health care provider: Secondary | ICD-10-CM | POA: Insufficient documentation

## 2021-11-07 DIAGNOSIS — R059 Cough, unspecified: Secondary | ICD-10-CM | POA: Insufficient documentation

## 2021-11-07 LAB — SARS CORONAVIRUS 2 BY RT PCR: SARS Coronavirus 2 by RT PCR: NEGATIVE

## 2021-11-07 NOTE — ED Triage Notes (Signed)
Patient arrived via POV c/o cough x 3 days. Patient states "dry cough" no other s/s. Patient is AO x 4, VS w/ elevated temp and BP, normal gait.

## 2022-02-03 IMAGING — DX DG CHEST 1V PORT
1 series · 1 of 1 positions shown · non-contrast
Comparison: None.

CLINICAL DATA: 49-year-old male with fever and headache since 7577
hours.

EXAM:
PORTABLE CHEST 1 VIEW

[chest ap]
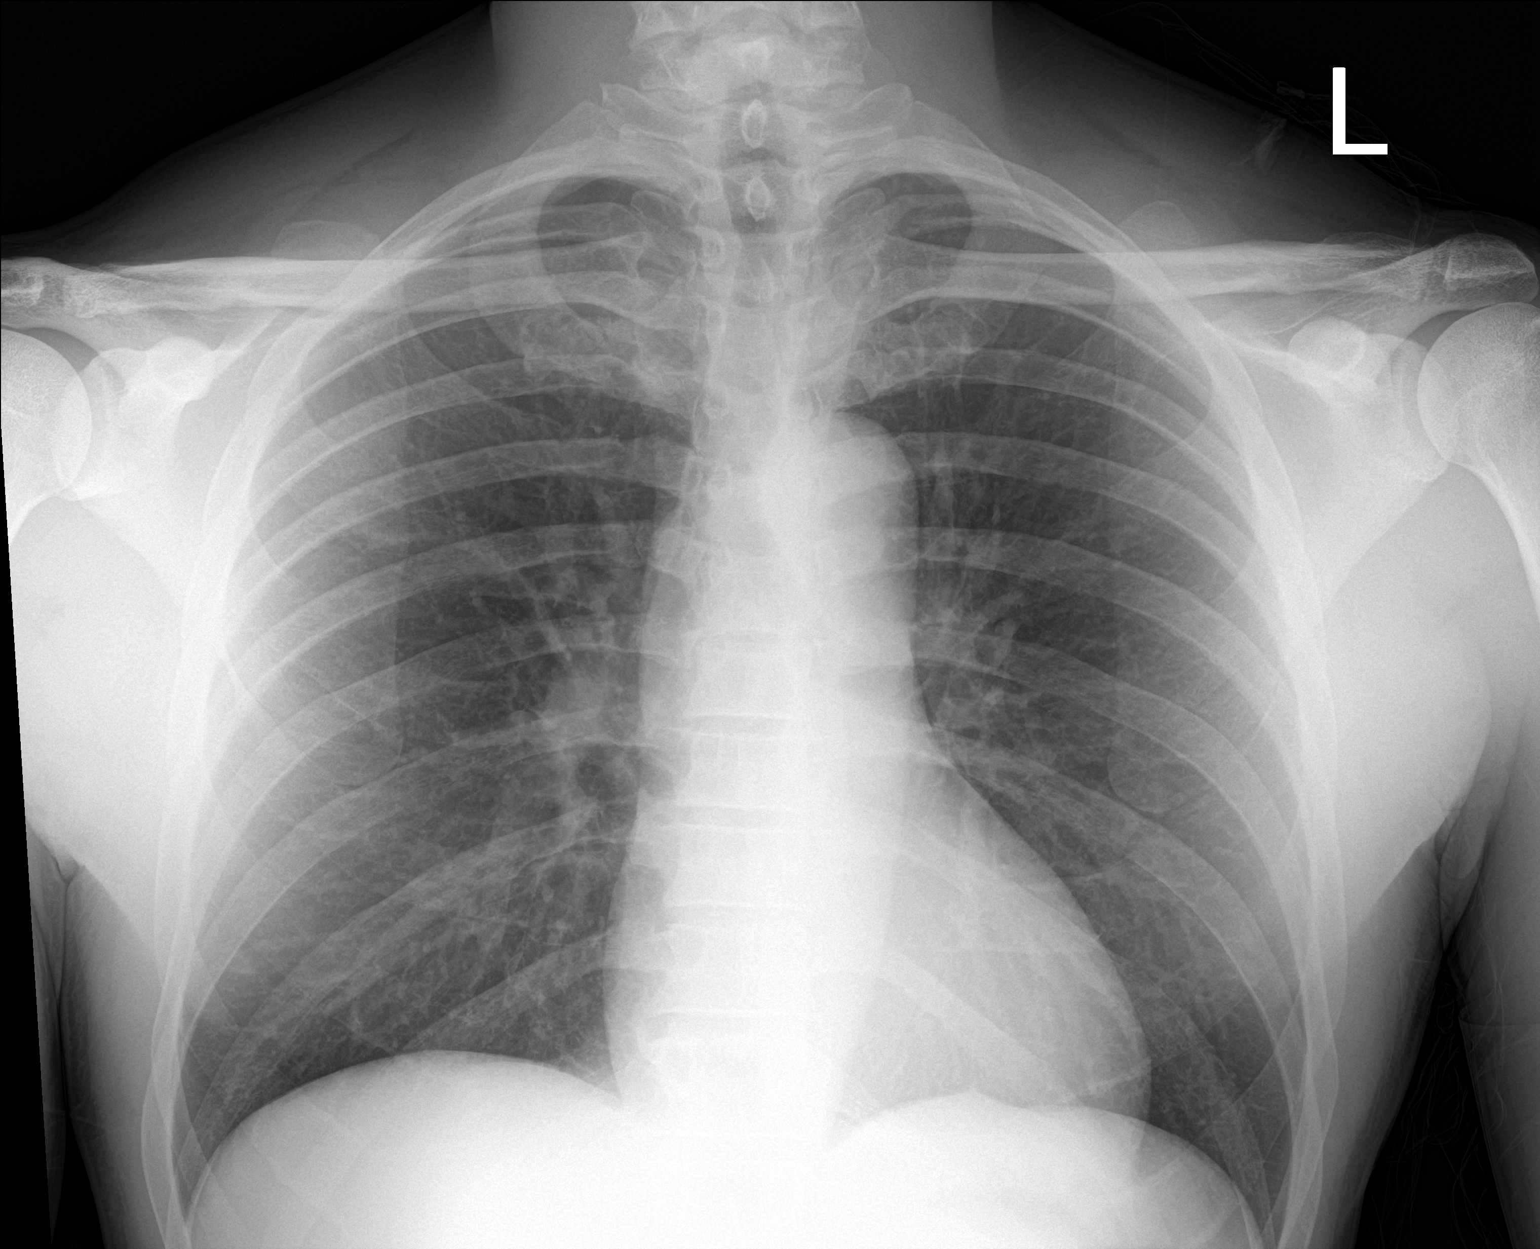

[1 of 1 positions shown; findings below may reference images not displayed]

FINDINGS: Portable AP view at 7573 hours. Lung volumes and mediastinal
contours are normal. Visualized tracheal air column is within normal
limits. Allowing for portable technique the lungs are clear. No
pneumothorax or pleural effusion. No osseous abnormality identified.
IMPRESSION: Negative portable chest.
